# Patient Record
Sex: Female | Born: 1995 | Race: Black or African American | Hispanic: No | Marital: Single | Smoking: Never smoker
Health system: Southern US, Community
[De-identification: ages and names within clinical notes are randomized; demographics above are authoritative.]

## PROBLEM LIST (undated history)

## (undated) DIAGNOSIS — E282 Polycystic ovarian syndrome: Secondary | ICD-10-CM

## (undated) DIAGNOSIS — E669 Obesity, unspecified: Secondary | ICD-10-CM

## (undated) HISTORY — DX: Obesity, unspecified: E66.9

---
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
## 2015-04-16 DIAGNOSIS — Z6831 Body mass index (BMI) 31.0-31.9, adult: Secondary | ICD-10-CM | POA: Insufficient documentation

## 2015-04-23 ENCOUNTER — Emergency Department (HOSPITAL_COMMUNITY)
Admission: EM | Admit: 2015-04-23 | Discharge: 2015-04-23 | Disposition: A | Discharge status: Home / Self Care | Payer: Medicaid Other | Attending: Emergency Medicine | Admitting: Emergency Medicine

## 2015-04-23 ENCOUNTER — Emergency Department (HOSPITAL_COMMUNITY): Payer: Medicaid Other

## 2015-04-23 ENCOUNTER — Encounter (HOSPITAL_COMMUNITY): Payer: Self-pay

## 2015-04-23 DIAGNOSIS — R109 Unspecified abdominal pain: Secondary | ICD-10-CM | POA: Insufficient documentation

## 2015-04-23 DIAGNOSIS — R350 Frequency of micturition: Secondary | ICD-10-CM | POA: Insufficient documentation

## 2015-04-23 DIAGNOSIS — O9989 Other specified diseases and conditions complicating pregnancy, childbirth and the puerperium: Secondary | ICD-10-CM | POA: Diagnosis present

## 2015-04-23 DIAGNOSIS — O209 Hemorrhage in early pregnancy, unspecified: Secondary | ICD-10-CM

## 2015-04-23 DIAGNOSIS — Z3A01 Less than 8 weeks gestation of pregnancy: Secondary | ICD-10-CM | POA: Insufficient documentation

## 2015-04-23 LAB — WET PREP, GENITAL
Trich, Wet Prep: NONE SEEN
Yeast Wet Prep HPF POC: NONE SEEN

## 2015-04-23 LAB — URINALYSIS, ROUTINE W REFLEX MICROSCOPIC
Bilirubin Urine: NEGATIVE
Glucose, UA: NEGATIVE mg/dL
Hgb urine dipstick: NEGATIVE
Ketones, ur: NEGATIVE mg/dL
Leukocytes, UA: NEGATIVE
Nitrite: NEGATIVE
Protein, ur: NEGATIVE mg/dL
Specific Gravity, Urine: 1.005 (ref 1.005–1.030)
Urobilinogen, UA: 1 mg/dL (ref 0.0–1.0)
pH: 7.5 (ref 5.0–8.0)

## 2015-04-23 LAB — COMPREHENSIVE METABOLIC PANEL
ALT: 11 U/L — AB (ref 14–54)
ANION GAP: 9 (ref 5–15)
AST: 17 U/L (ref 15–41)
Albumin: 4 g/dL (ref 3.5–5.0)
Alkaline Phosphatase: 72 U/L (ref 38–126)
BILIRUBIN TOTAL: 1.2 mg/dL (ref 0.3–1.2)
CALCIUM: 9 mg/dL (ref 8.9–10.3)
CO2: 24 mmol/L (ref 22–32)
Chloride: 103 mmol/L (ref 101–111)
Creatinine, Ser: 0.75 mg/dL (ref 0.44–1.00)
GFR calc non Af Amer: >60 mL/min (ref >60)
Glucose, Bld: 91 mg/dL (ref 65–99)
Potassium: 3.9 mmol/L (ref 3.5–5.1)
Sodium: 136 mmol/L (ref 135–145)
Total Protein: 7 g/dL (ref 6.5–8.1)

## 2015-04-23 LAB — CBC WITH DIFFERENTIAL/PLATELET
Basophils Absolute: 0 10*3/uL (ref 0.0–0.1)
Basophils Relative: 1 % (ref 0–1)
EOS PCT: 1 % (ref 0–5)
Eosinophils Absolute: 0.1 10*3/uL (ref 0.0–0.7)
HCT: 33.8 % — ABNORMAL LOW (ref 36.0–46.0)
HEMOGLOBIN: 11.4 g/dL — AB (ref 12.0–15.0)
LYMPHS PCT: 20 % (ref 12–46)
Lymphs Abs: 1.2 10*3/uL (ref 0.7–4.0)
MCH: 28 pg (ref 26.0–34.0)
MCHC: 33.7 g/dL (ref 30.0–36.0)
MCV: 83 fL (ref 78.0–100.0)
MONO ABS: 0.8 10*3/uL (ref 0.1–1.0)
MONOS PCT: 14 % — AB (ref 3–12)
Neutro Abs: 3.7 10*3/uL (ref 1.7–7.7)
Neutrophils Relative %: 64 % (ref 43–77)
Platelets: 306 10*3/uL (ref 150–400)
RBC: 4.07 MIL/uL (ref 3.87–5.11)
RDW: 13.4 % (ref 11.5–15.5)
WBC: 5.8 10*3/uL (ref 4.0–10.5)

## 2015-04-23 LAB — POC URINE PREG, ED: PREG TEST UR: POSITIVE — AB

## 2015-04-23 LAB — HCG, QUANTITATIVE, PREGNANCY: hCG, Beta Chain, Quant, S: 257 m[IU]/mL — ABNORMAL HIGH (ref <5)

## 2015-04-23 NOTE — ED Notes (Signed)
Pt c/o sharp abd pains x3 days. Denies n/v/d. Able to tolerate PO food/fluids. LMP last week of April.

## 2015-04-23 NOTE — ED Provider Notes (Signed)
CSN: [id number]     Arrival date & time 04/23/15  7829 History   First MD Initiated Contact with Patient 04/23/15 [phone]     Chief Complaint  Patient presents with  . Abdominal Pain     (Consider location/radiation/quality/duration/timing/severity/associated sxs/prior Treatment) HPI Comments: Suprapubic abdominal cramping for 3 days, described as sharp, intermittent pains without radiation.  She has had no associated nausea, vomiting or diarrhea.  She has had urinary frequency without dysuria or hematuria.  She states she has not had a period about 6 weeks and she has noticed some breast tenderness.  She took a home pregnancy test yesterday which was positive  Patient is a 19 y.o. female presenting with abdominal pain. The history is provided by the patient. No language interpreter was used.  Abdominal Pain Associated symptoms: no chest pain, no chills, no cough, no diarrhea, no dysuria, no fatigue, no fever, no nausea, no shortness of breath, no sore throat and no vomiting     History reviewed. No pertinent past medical history. History reviewed. No pertinent past surgical history. History reviewed. No pertinent family history. History  Substance Use Topics  . Smoking status: Never Smoker   . Smokeless tobacco: Not on file  . Alcohol Use: Not on file   OB History    No data available     Review of Systems  Constitutional: Negative for fever, chills, diaphoresis, activity change, appetite change and fatigue.  HENT: Negative for congestion, facial swelling, rhinorrhea and sore throat.   Eyes: Negative for photophobia and discharge.  Respiratory: Negative for cough, chest tightness and shortness of breath.   Cardiovascular: Negative for chest pain, palpitations and leg swelling.  Gastrointestinal: Positive for abdominal pain. Negative for nausea, vomiting and diarrhea.  Endocrine: Negative for polydipsia and polyuria.  Genitourinary: Negative for dysuria, frequency, difficulty  urinating and pelvic pain.  Musculoskeletal: Negative for back pain, arthralgias, neck pain and neck stiffness.  Skin: Negative for color change and wound.  Allergic/Immunologic: Negative for immunocompromised state.  Neurological: Negative for facial asymmetry, weakness, numbness and headaches.  Hematological: Does not bruise/bleed easily.  Psychiatric/Behavioral: Negative for confusion and agitation.      Allergies  Coconut oil  Home Medications   Prior to Admission medications   Medication Sig Start Date End Date Taking? Authorizing Provider  ibuprofen (ADVIL,MOTRIN) 200 MG tablet Take 200 mg by mouth every 6 (six) hours as needed for mild pain.   Yes Historical Provider, MD   BP 127/68 mmHg  Pulse 90  Temp(Src) 98.8 F (37.1 C)  Resp 16  SpO2 100%  LMP 03/14/2015 Physical Exam  Constitutional: She is oriented to person, place, and time. She appears well-developed and well-nourished. No distress.  HENT:  Head: Normocephalic and atraumatic.  Mouth/Throat: No oropharyngeal exudate.  Eyes: Pupils are equal, round, and reactive to light.  Neck: Normal range of motion. Neck supple.  Cardiovascular: Normal rate, regular rhythm and normal heart sounds.  Exam reveals no gallop and no friction rub.   No murmur heard. Pulmonary/Chest: Effort normal and breath sounds normal. No respiratory distress. She has no wheezes. She has no rales.  Abdominal: Soft. Bowel sounds are normal. She exhibits no distension and no mass. There is no tenderness. There is no rebound and no guarding.  Musculoskeletal: Normal range of motion. She exhibits no edema or tenderness.  Neurological: She is alert and oriented to person, place, and time.  Skin: Skin is warm and dry.  Psychiatric: She has a normal mood and  affect.    ED Course  Procedures (including critical care time) Labs Review Labs Reviewed  WET PREP, GENITAL - Abnormal; Notable for the following:    Clue Cells Wet Prep HPF POC FEW (*)     WBC, Wet Prep HPF POC MANY (*)    All other components within normal limits  CBC WITH DIFFERENTIAL/PLATELET - Abnormal; Notable for the following:    Hemoglobin 11.4 (*)    HCT 33.8 (*)    Monocytes Relative 14 (*)    All other components within normal limits  COMPREHENSIVE METABOLIC PANEL - Abnormal; Notable for the following:    BUN <5 (*)    ALT 11 (*)    All other components within normal limits  HCG, QUANTITATIVE, PREGNANCY - Abnormal; Notable for the following:    hCG, Beta Chain, Quant, S 257 (*)    All other components within normal limits  POC URINE PREG, ED - Abnormal; Notable for the following:    Preg Test, Ur POSITIVE (*)    All other components within normal limits  URINALYSIS, ROUTINE W REFLEX MICROSCOPIC (NOT AT [facility])  RPR  HIV ANTIBODY (ROUTINE TESTING)  POC URINE PREG, ED  ABO/RH  GC/CHLAMYDIA PROBE AMP ([place]) NOT AT [facility]    Imaging Review [place] Ob Comp Less 14 Wks  04/23/2015   CLINICAL DATA:  Initial encounter for abdominal pain with positive pregnancy test.  EXAM: OBSTETRIC <14 WK US AND TRANSVAGINAL OB US  TECHNIQUE: Both transabdominal and transvaginal ultrasound examinations were performed for complete evaluation of the gestation as well as the maternal uterus, adnexal regions, and pelvic cul-de-sac. Transvaginal technique was performed to assess early pregnancy.  COMPARISON:  None.  FINDINGS: Intrauterine gestational sac: Not visualized  Yolk sac:  Not visualized  Embryo:  Not visualized  Maternal uterus/adnexae: Maternal right ovary is unremarkable. Thick walled cystic lesion identified in the left ovary without substantial hyperemia on color Doppler assessment. Small amount of free fluid is identified in the cul-de-sac and left adnexal space.  IMPRESSION: No intrauterine gestational sac. Given the history of a positive pregnancy test, differential considerations include intrauterine gestation too early to visualize, completed abortion, or nonvisualized  ectopic pregnancy. Cystic lesion identified in the left ovary shows no substantial associated hyperemia. This is probably a corpus luteum cyst rather than intra-ovarian ectopic pregnancy given that intra-ovarian ectopic gestation is quite rare. Close clinical correlation is recommended with serial beta-hCG and followup ultrasound as warranted.   Electronically Signed   By: Kennith CenterEric  Mansell M.D.   On: 04/23/2015 14:09   [place] Ob Transvaginal  04/23/2015   CLINICAL DATA:  Initial encounter for abdominal pain with positive pregnancy test.  EXAM: OBSTETRIC <14 WK US AND TRANSVAGINAL OB US  TECHNIQUE: Both transabdominal and transvaginal ultrasound examinations were performed for complete evaluation of the gestation as well as the maternal uterus, adnexal regions, and pelvic cul-de-sac. Transvaginal technique was performed to assess early pregnancy.  COMPARISON:  None.  FINDINGS: Intrauterine gestational sac: Not visualized  Yolk sac:  Not visualized  Embryo:  Not visualized  Maternal uterus/adnexae: Maternal right ovary is unremarkable. Thick walled cystic lesion identified in the left ovary without substantial hyperemia on color Doppler assessment. Small amount of free fluid is identified in the cul-de-sac and left adnexal space.  IMPRESSION: No intrauterine gestational sac. Given the history of a positive pregnancy test, differential considerations include intrauterine gestation too early to visualize, completed abortion, or nonvisualized ectopic pregnancy. Cystic lesion identified in the left ovary  shows no substantial associated hyperemia. This is probably a corpus luteum cyst rather than intra-ovarian ectopic pregnancy given that intra-ovarian ectopic gestation is quite rare. Close clinical correlation is recommended with serial beta-hCG and followup ultrasound as warranted.   Electronically Signed   By: Kennith Center M.D.   On: 04/23/2015 14:09     EKG Interpretation None      MDM   Final diagnoses:   Abdominal pain  Vaginal bleeding in pregnancy, first trimester    Pt is a 19 y.o. female with Pmhx as above who presents with sharp intermittent low abdominal cramping for 3 days.  She has not had a period for about 6 weeks and is also had breast tenderness.  She states she took a home pregnancy test yesterday which was positive.  She denies vaginal bleeding or discharge.  She has had urinary frequency.  On physical exam, vital signs are stable and she is in no acute distress.  Abdominal exam is benign.  She has scant vaginal bleeding from a closed os on pelvic exam, no adnexal cervical motion tenderness or uterine tenderness.   Beta-hCG is 257.  Blood type is B+.  SHE sound shows no intrauterine gestational sac.  She has a cystic lesion identified in the left ovary with no substantial hyperemia, which radiology feels this likely corpus luteum cyst rather than intraovarian ectopic pregnancy given the rarity of this condition.  This is supported by her lack of pain at this location on exam.  I spoke with the [facility] OB on-call, who is asked that she come to the MAU 48 hours for repeat beta hCG and ultrasound as we cannot rule out ectopic.  Patient has been given bleeding precautions for ectopic coating increasing vaginal bleeding, Heavy bleeding, syncope.    [name] evaluation in the Emergency Department is complete. It has been determined that no acute conditions requiring further emergency intervention are present at this time. The patient/guardian have been advised of the diagnosis and plan. We have discussed signs and symptoms that warrant return to the ED, such as changes or worsening in symptoms, worsening pain, worsening bleeding, passing out.       Toy Cookey, MD 04/23/15 1536

## 2015-04-23 NOTE — Discharge Instructions (Signed)

## 2015-04-24 LAB — HIV ANTIBODY (ROUTINE TESTING W REFLEX): HIV Screen 4th Generation wRfx: NONREACTIVE

## 2015-04-24 LAB — RPR: RPR Ser Ql: NONREACTIVE

## 2015-04-25 ENCOUNTER — Inpatient Hospital Stay (HOSPITAL_COMMUNITY)
Admission: AD | Admit: 2015-04-25 | Discharge: 2015-04-25 | Disposition: A | Discharge status: Home / Self Care | Payer: Medicaid Other | Source: Ambulatory Visit | Attending: Obstetrics & Gynecology | Admitting: Obstetrics & Gynecology

## 2015-04-25 ENCOUNTER — Encounter (HOSPITAL_COMMUNITY): Payer: Self-pay | Admitting: *Deleted

## 2015-04-25 DIAGNOSIS — O3680X0 Pregnancy with inconclusive fetal viability, not applicable or unspecified: Secondary | ICD-10-CM

## 2015-04-25 DIAGNOSIS — Z3A01 Less than 8 weeks gestation of pregnancy: Secondary | ICD-10-CM | POA: Insufficient documentation

## 2015-04-25 DIAGNOSIS — O9989 Other specified diseases and conditions complicating pregnancy, childbirth and the puerperium: Secondary | ICD-10-CM | POA: Diagnosis not present

## 2015-04-25 DIAGNOSIS — R1030 Lower abdominal pain, unspecified: Secondary | ICD-10-CM | POA: Diagnosis not present

## 2015-04-25 LAB — ABO/RH: ABO/RH(D): B POS

## 2015-04-25 LAB — HCG, QUANTITATIVE, PREGNANCY: hCG, Beta Chain, Quant, S: 403 m[IU]/mL — ABNORMAL HIGH (ref <5)

## 2015-04-25 NOTE — MAU Provider Note (Signed)
History     CSN: [id number]  Arrival date and time: 04/25/15 1013      No chief complaint on file.  HPI Comments: Renee Lester is a 19 y.o. G1P0 at [redacted]w[redacted]d who was seen at [facility] ED 2 days ago with 3d hx of lower abdominal cramping. She had twinges of lower abdominal pain yesterday but none since then. She has not had any vaginal bleeding. Pregnancy is desired. She has good support.   Abdominal Pain This is a recurrent problem. The current episode started in the past 7 days. The problem occurs rarely. The problem has been gradually improving. The pain is located in the suprapubic region. The pain is at a severity of 0/10. The patient is experiencing no pain. The quality of the pain is cramping. Pertinent negatives include no anorexia, constipation, diarrhea, dysuria, fever, frequency, headaches, nausea or vomiting. Nothing aggravates the pain. The pain is relieved by nothing. She has tried nothing for the symptoms.      OB History    Gravida Para Term Preterm AB TAB SAB Ectopic Multiple Living   1                 No past surgical history on file.  No family history on file.  History  Substance Use Topics  . Smoking status: Never Smoker   . Smokeless tobacco: Not on file  . Alcohol Use: Not on file    Allergies:  Allergies  Allergen Reactions  . Coconut Oil Other (See Comments)    Any coconut product." Tongue gets bumps on it."    Prescriptions prior to admission  Medication Sig Dispense Refill Last Dose  . ibuprofen (ADVIL,MOTRIN) 200 MG tablet Take 200 mg by mouth every 6 (six) hours as needed for mild pain.   Past Month at Unknown time    Review of Systems  Constitutional: Negative for fever and chills.  Cardiovascular: Negative for chest pain.  Gastrointestinal: Positive for abdominal pain. Negative for nausea, vomiting, diarrhea, constipation and anorexia.  Genitourinary: Negative for dysuria and frequency.  Neurological: Negative for dizziness and headaches.   Psychiatric/Behavioral: Negative for depression. The patient is not nervous/anxious.    Physical Exam   Last menstrual period 03/14/2015.  Physical Exam  Nursing note and vitals reviewed. Constitutional: She is oriented to person, place, and time. She appears well-developed and well-nourished.  Cardiovascular: Normal rate.   GI: Soft. There is no tenderness.  Neurological: She is alert and oriented to person, place, and time.  Skin: Skin is warm and dry.  Psychiatric: She has a normal mood and affect. Her behavior is normal.     CLINICAL DATA: Initial encounter for abdominal pain with positive pregnancy test.  EXAM: OBSTETRIC <14 WK [place] AND TRANSVAGINAL OB US  TECHNIQUE: Both transabdominal and transvaginal ultrasound examinations were performed for complete evaluation of the gestation as well as the maternal uterus, adnexal regions, and pelvic cul-de-sac. Transvaginal technique was performed to assess early pregnancy.  COMPARISON: None.  FINDINGS: Intrauterine gestational sac: Not visualized  Yolk sac: Not visualized  Embryo: Not visualized  Maternal uterus/adnexae: Maternal right ovary is unremarkable. Thick walled cystic lesion identified in the left ovary without substantial hyperemia on color Doppler assessment. Small amount of free fluid is identified in the cul-de-sac and left adnexal space.  IMPRESSION: No intrauterine gestational sac. Given the history of a positive pregnancy test, differential considerations include intrauterine gestation too early to visualize, completed abortion, or nonvisualized ectopic pregnancy. Cystic lesion identified in  the left ovary shows no substantial associated hyperemia. This is probably a corpus luteum cyst rather than intra-ovarian ectopic pregnancy given that intra-ovarian ectopic gestation is quite rare. Close clinical correlation is recommended with serial beta-hCG and followup ultrasound as  warranted.   Electronically Signed  By: Kennith CenterEric Mansell M.D.  On: 04/23/2015 14:09        Ref Range 2d ago    hCG, Beta Chain, Quant, S <5 mIU/mL 257 (H)   Comments:              MAU Course  Procedures Results for orders placed or performed during the hospital encounter of 04/25/15 (from the past 24 hour(s))  hCG, quantitative, pregnancy     Status: Abnormal   Collection Time: 04/25/15 10:37 AM  Result Value Ref Range   hCG, Beta Chain, Quant, S 403 (H) <5 mIU/mL   MDM Appropriate rise in quant  Assessment and Plan   1. Pregnancy, location unknown     Discharge home in stable condition with ectopic precautions Follow-up Information    Follow up with Nursepractioner Mau, NP In 1 week.   Why:  Someone from ultrasound will call you with appt.      Malon Siddall 04/25/2015, 10:25 AM

## 2015-04-25 NOTE — Discharge Instructions (Signed)
°Ectopic Pregnancy °An ectopic pregnancy is when the fertilized egg attaches (implants) outside the uterus. Most ectopic pregnancies occur in the fallopian tube. Rarely do ectopic pregnancies occur on the ovary, intestine, pelvis, or cervix. In an ectopic pregnancy, the fertilized egg does not have the ability to develop into a normal, healthy baby.  °A ruptured ectopic pregnancy is one in which the fallopian tube gets torn or bursts and results in internal bleeding. Often there is intense abdominal pain, and sometimes, vaginal bleeding. Having an ectopic pregnancy can be life threatening. If left untreated, this dangerous condition can lead to a blood transfusion, abdominal surgery, or even death. °CAUSES  °Damage to the fallopian tubes is the suspected cause in most ectopic pregnancies.  °RISK FACTORS °Depending on your circumstances, the risk of having an ectopic pregnancy will vary. The level of risk can be divided into three categories. °High Risk °· You have gone through infertility treatment. °· You have had a previous ectopic pregnancy. °· You have had previous tubal surgery. °· You have had previous surgery to have the fallopian tubes tied (tubal ligation). °· You have tubal problems or diseases. °· You have been exposed to DES. DES is a medicine that was used until 1971 and had effects on babies whose mothers took the medicine. °· You become pregnant while using an intrauterine device (IUD) for birth control.  °Moderate Risk °· You have a history of infertility. °· You have a history of a sexually transmitted infection (STI). °· You have a history of pelvic inflammatory disease (PID). °· You have scarring from endometriosis. °· You have multiple sexual partners. °· You smoke.  °Low Risk °· You have had previous pelvic surgery. °· You use vaginal douching. °· You became sexually active before 18 years of age. °SIGNS AND SYMPTOMS  °An ectopic pregnancy should be suspected in anyone who has missed a period  and has abdominal pain or bleeding. °· You may experience normal pregnancy symptoms, such as: °¨ Nausea. °¨ Tiredness. °¨ Breast tenderness. °· Other symptoms may include: °¨ Pain with intercourse. °¨ Irregular vaginal bleeding or spotting. °¨ Cramping or pain on one side or in the lower abdomen. °¨ Fast heartbeat. °¨ Passing out while having a bowel movement. °· Symptoms of a ruptured ectopic pregnancy and internal bleeding may include: °¨ Sudden, severe pain in the abdomen and pelvis. °¨ Dizziness or fainting. °¨ Pain in the shoulder area. °DIAGNOSIS  °Tests that may be performed include: °· A pregnancy test. °· An ultrasound test. °· Testing the specific level of pregnancy hormone in the bloodstream. °· Taking a sample of uterus tissue (dilation and curettage, D&C). °· Surgery to perform a visual exam of the inside of the abdomen using a thin, lighted tube with a tiny camera on the end (laparoscope). °TREATMENT  °An injection of a medicine called methotrexate may be given. This medicine causes the pregnancy tissue to be absorbed. It is given if: °· The diagnosis is made early. °· The fallopian tube has not ruptured. °· You are considered to be a good candidate for the medicine. °Usually, pregnancy hormone blood levels are checked after methotrexate treatment. This is to be sure the medicine is effective. It may take 4-6 weeks for the pregnancy to be absorbed (though most pregnancies will be absorbed by 3 weeks). °Surgical treatment may be needed. A laparoscope may be used to remove the pregnancy tissue. If severe internal bleeding occurs, a cut (incision) may be made in the lower abdomen (laparotomy), and the ectopic   pregnancy is removed. This stops the bleeding. Part of the fallopian tube, or the whole tube, may be removed as well (salpingectomy). After surgery, pregnancy hormone tests may be done to be sure there is no pregnancy tissue left. You may receive a Rho (D) immune globulin shot if you are Rh negative  and the father is Rh positive, or if you do not know the Rh type of the father. This is to prevent problems with any future pregnancy. °SEEK IMMEDIATE MEDICAL CARE IF:  °You have any symptoms of an ectopic pregnancy. This is a medical emergency. °MAKE SURE YOU: °· Understand these instructions. °· Will watch your condition. °· Will get help right away if you are not doing well or get worse. °Document Released: 12/14/2004 Document Revised: 03/23/2014 Document Reviewed: 06/05/2013 °ExitCare® Patient Information ©2015 [employer]. This information is not intended to replace advice given to you by your health care provider. Make sure you discuss any questions you have with your health care provider. ° ° °

## 2015-04-26 LAB — GC/CHLAMYDIA PROBE AMP (~~LOC~~) NOT AT ARMC
Chlamydia: NEGATIVE
NEISSERIA GONORRHEA: NEGATIVE

## 2015-05-06 ENCOUNTER — Ambulatory Visit (HOSPITAL_COMMUNITY)
Admission: RE | Admit: 2015-05-06 | Discharge: 2015-05-06 | Disposition: A | Discharge status: Home / Self Care | Payer: Medicaid Other | Source: Ambulatory Visit | Attending: Obstetrics and Gynecology | Admitting: Obstetrics and Gynecology

## 2015-05-06 ENCOUNTER — Inpatient Hospital Stay (HOSPITAL_COMMUNITY)
Admission: AD | Admit: 2015-05-06 | Discharge: 2015-05-06 | Disposition: A | Discharge status: Home / Self Care | Payer: Medicaid Other | Source: Ambulatory Visit | Attending: Obstetrics & Gynecology | Admitting: Obstetrics & Gynecology

## 2015-05-06 DIAGNOSIS — Z3A01 Less than 8 weeks gestation of pregnancy: Secondary | ICD-10-CM | POA: Insufficient documentation

## 2015-05-06 DIAGNOSIS — R109 Unspecified abdominal pain: Secondary | ICD-10-CM

## 2015-05-06 DIAGNOSIS — O9989 Other specified diseases and conditions complicating pregnancy, childbirth and the puerperium: Secondary | ICD-10-CM | POA: Insufficient documentation

## 2015-05-06 DIAGNOSIS — O26899 Other specified pregnancy related conditions, unspecified trimester: Secondary | ICD-10-CM

## 2015-05-06 DIAGNOSIS — O3680X0 Pregnancy with inconclusive fetal viability, not applicable or unspecified: Secondary | ICD-10-CM

## 2015-05-06 NOTE — MAU Provider Note (Signed)
Renee Lester is a 19 y.o. G1P0 at [redacted]w[redacted]d who presents to MAU today for follow-up US results. She denies abdominal pain, vaginal bleeding, N/V or fever today.   LMP 03/14/2015  GENERAL: Well-developed, well-nourished female in no acute distress.  HEENT: Normocephalic, atraumatic.   LUNGS: Effort normal HEART: Regular rate  SKIN: Warm, dry and without erythema PSYCH: Normal mood and affect  US Ob Transvaginal  05/06/2015   CLINICAL DATA:  Pregnancy. No pain or bleeding. Beta HCG 257 13 days ago  EXAM: TRANSVAGINAL OB ULTRASOUND  TECHNIQUE: Transvaginal ultrasound was performed for complete evaluation of the gestation as well as the maternal uterus, adnexal regions, and pelvic cul-de-sac.  COMPARISON:  None.  FINDINGS: Intrauterine gestational sac: Visualized/normal in shape.  Yolk sac:  Not visualized  Embryo:  Not visualized  Cardiac Activity: Not visualize  Heart Rate: Not visualized bpm  MSD: 0.57 cm  mm   5 w   1  d  CRL:     mm    w  d                  [place] EDC:  Maternal uterus/adnexae: New probable corpus luteum cyst left ovary. Trace free fluid.  IMPRESSION: Early intrauterine pregnancy estimated 5 week 1 day. Fetus and yolk sac not visualized. Follow-up recommended to confirm viable pregnancy.   Electronically Signed   By: Marlan Palau M.D.   On: 05/06/2015 14:33    A: IUGS at [redacted]w[redacted]d Pregnancy of unknown location  P: Discharge home Outpatient [place] ordered for 1 week to confirm viability Ectopic precautions discussed Patient may return to MAU as needed or if her condition were to change or worsen   Marny Lowenstein, PA-C  05/06/2015 2:46 PM

## 2015-05-06 NOTE — Progress Notes (Signed)
Only seen by APP

## 2015-05-06 NOTE — Discharge Instructions (Signed)
First Trimester of Pregnancy The first trimester of pregnancy is from week 1 until the end of week 12 (months 1 through 3). During this time, your baby will begin to develop inside you. At 6-8 weeks, the eyes and face are formed, and the heartbeat can be seen on ultrasound. At the end of 12 weeks, all the baby's organs are formed. Prenatal care is all the medical care you receive before the birth of your baby. Make sure you get good prenatal care and follow all of your doctor's instructions. HOME CARE  Medicines  Take medicine only as told by your doctor. Some medicines are safe and some are not during pregnancy.  Take your prenatal vitamins as told by your doctor.  Take medicine that helps you poop (stool softener) as needed if your doctor says it is okay. Diet  Eat regular, healthy meals.  Your doctor will tell you the amount of weight gain that is right for you.  Avoid raw meat and uncooked cheese.  If you feel sick to your stomach (nauseous) or throw up (vomit):  Eat 4 or 5 small meals a day instead of 3 large meals.  Try eating a few soda crackers.  Drink liquids between meals instead of during meals.  If you have a hard time pooping (constipation):  Eat high-fiber foods like fresh vegetables, fruit, and whole grains.  Drink enough fluids to keep your pee (urine) clear or pale yellow. Activity and Exercise  Exercise only as told by your doctor. Stop exercising if you have cramps or pain in your lower belly (abdomen) or low back.  Try to avoid standing for long periods of time. Move your legs often if you must stand in one place for a long time.  Avoid heavy lifting.  Wear low-heeled shoes. Sit and stand up straight.  You can have sex unless your doctor tells you not to. Relief of Pain or Discomfort  Wear a good support bra if your breasts are sore.  Take warm water baths (sitz baths) to soothe pain or discomfort caused by hemorrhoids. Use hemorrhoid cream if your  doctor says it is okay.  Rest with your legs raised if you have leg cramps or low back pain.  Wear support hose if you have puffy, bulging veins (varicose veins) in your legs. Raise (elevate) your feet for 15 minutes, 3-4 times a day. Limit salt in your diet. Prenatal Care  Schedule your prenatal visits by the twelfth week of pregnancy.  Write down your questions. Take them to your prenatal visits.  Keep all your prenatal visits as told by your doctor. Safety  Wear your seat belt at all times when driving.  Make a list of emergency phone numbers. The list should include numbers for family, friends, the hospital, and police and fire departments. General Tips  Ask your doctor for a referral to a local prenatal class. Begin classes no later than at the start of month 6 of your pregnancy.  Ask for help if you need counseling or help with nutrition. Your doctor can give you advice or tell you where to go for help.  Do not use hot tubs, steam rooms, or saunas.  Do not douche or use tampons or scented sanitary pads.  Do not cross your legs for long periods of time.  Avoid litter boxes and soil used by cats.  Avoid all smoking, herbs, and alcohol. Avoid drugs not approved by your doctor.  Visit your dentist. At home, brush your teeth   with a soft toothbrush. Be gentle when you floss. GET HELP IF:  You are dizzy.  You have mild cramps or pressure in your lower belly.  You have a nagging pain in your belly area.  You continue to feel sick to your stomach, throw up, or have watery poop (diarrhea).  You have a bad smelling fluid coming from your vagina.  You have pain with peeing (urination).  You have increased puffiness (swelling) in your face, hands, legs, or ankles. GET HELP RIGHT AWAY IF:   You have a fever.  You are leaking fluid from your vagina.  You have spotting or bleeding from your vagina.  You have very bad belly cramping or pain.  You gain or lose weight  rapidly.  You throw up blood. It may look like coffee grounds.  You are around people who have German measles, fifth disease, or chickenpox.  You have a very bad headache.  You have shortness of breath.  You have any kind of trauma, such as from a fall or a car accident. Document Released: 04/24/2008 Document Revised: 03/23/2014 Document Reviewed: 09/16/2013 ExitCare Patient Information 2015 [employer]. This information is not intended to replace advice given to you by your health care provider. Make sure you discuss any questions you have with your health care provider.  

## 2015-05-14 ENCOUNTER — Inpatient Hospital Stay (HOSPITAL_COMMUNITY)
Admission: AD | Admit: 2015-05-14 | Discharge: 2015-05-14 | Disposition: A | Discharge status: Home / Self Care | Payer: Medicaid Other | Source: Ambulatory Visit | Attending: Obstetrics and Gynecology | Admitting: Obstetrics and Gynecology

## 2015-05-14 ENCOUNTER — Ambulatory Visit (HOSPITAL_COMMUNITY)
Admission: RE | Admit: 2015-05-14 | Discharge: 2015-05-14 | Disposition: A | Discharge status: Home / Self Care | Payer: Medicaid Other | Source: Ambulatory Visit | Attending: Medical | Admitting: Medical

## 2015-05-14 DIAGNOSIS — R109 Unspecified abdominal pain: Secondary | ICD-10-CM

## 2015-05-14 DIAGNOSIS — Z36 Encounter for antenatal screening of mother: Secondary | ICD-10-CM | POA: Insufficient documentation

## 2015-05-14 DIAGNOSIS — N831 Corpus luteum cyst: Secondary | ICD-10-CM | POA: Diagnosis not present

## 2015-05-14 DIAGNOSIS — O9989 Other specified diseases and conditions complicating pregnancy, childbirth and the puerperium: Secondary | ICD-10-CM

## 2015-05-14 DIAGNOSIS — Z3A01 Less than 8 weeks gestation of pregnancy: Secondary | ICD-10-CM | POA: Diagnosis not present

## 2015-05-14 DIAGNOSIS — O26899 Other specified pregnancy related conditions, unspecified trimester: Secondary | ICD-10-CM

## 2015-05-14 DIAGNOSIS — O3481 Maternal care for other abnormalities of pelvic organs, first trimester: Secondary | ICD-10-CM | POA: Diagnosis not present

## 2015-05-14 NOTE — MAU Provider Note (Signed)
Subjective:   Pt is a 19 y.o. G1P0 here for follow-up ultrasound.  Upon review of the records patient was first seen on 04/25/15 for abdominal pain.   BHCG on that day was 403.  Ultrasound showed no IUGS.  Wet prep and GC/CT collected.  Results were few clue on wet prep and neg GC/CT.   Pt discharged home with ectopic precautions and follow-up ultrasound in one week.  Last seen in MAU on 05/06/15.      Repeat ultrasound showed single IUGS with no yolk or embryo.    Pt here today with no report of vaginal bleeding or pelvic pain.   Objective: Physical Exam  There were no vitals filed for this visit. Constitutional: She is oriented to person, place, and time. She appears well-developed and well-nourished. No distress.  Neck: Normal range of motion.  Pulmonary/Chest: Effort normal. No respiratory distress.  Musculoskeletal: Normal range of motion.  Neurological: She is alert and oriented to person, place, and time.  Skin: Skin is warm and dry.   Ultrasound: Two adjacent intrauterine gestational sacs with MSD #1, 5 weeks 4 days and MSD #2, 5 weeks 0 days. No visualized yolk sac or embryo in either sac. Findings suspicious for non viability, although still may represent normal early twin pregnancy. Recommend correlation with serial quantitative beta HCG and follow-up ultrasound in 2 Weeks.  Assessment: 19 y.o. G1P0 at 5 wks Pregnancy   Plan: Consulted with Dr. Jolayne Panther > Reviewed HPI/Exam/ultrasound > repeat ultrasound in one week Reviewed ectopic/pregnancy precautions  Marlis Edelson, CNM

## 2015-05-14 NOTE — MAU Provider Note (Deleted)
  History     CSN: [id number]  Arrival date and time: 05/14/15 1551   None     No chief complaint on file.  HPI FILICIA LIENDO is 19 y.o. G1P0 [redacted]w[redacted]d weeks presenting for follow up ultrasound.  On 6/16 U/S showed her to be [redacted]w[redacted]d which is behind dates by LMP, without YS or Embryo.     No past medical history on file.  No past surgical history on file.  No family history on file.  History  Substance Use Topics  . Smoking status: Never Smoker   . Smokeless tobacco: Not on file  . Alcohol Use: Not on file    Allergies:  Allergies  Allergen Reactions  . Coconut Oil Other (See Comments)    Any coconut product." Tongue gets bumps on it."    No prescriptions prior to admission    ROS Physical Exam   Last menstrual period 03/14/2015.  Physical Exam     US Ob Transvaginal  05/14/2015   CLINICAL DATA:  Previous ultrasound 05/06/2015 demonstrating gestational sac only. Quantitative beta HCG 04/25/2015, 403. LMP unsure but sometime at the end of April. No vaginal bleeding or cramping.  EXAM: TRANSVAGINAL OB ULTRASOUND  TECHNIQUE: Transvaginal ultrasound was performed for complete evaluation of the gestation as well as the maternal uterus, adnexal regions, and pelvic cul-de-sac.  COMPARISON:  05/06/2015  FINDINGS: Intrauterine gestational sac: 2 adjacent intrauterine gestational sacs visualized/normal in shape.  Yolk sac:  Not visualized.  Embryo:  Not visualized.  Cardiac Activity: Not visualized.  Heart Rate: Not visualized.  MSD:  #1,  9.0  mm   5 w   4  d  MSD:  #2,   4.6 mm      5 w      0 d  No evidence of subchorionic hemorrhage.  Maternal uterus/adnexae: Ovaries are normal in size, shape position with small corpus luteal cyst over the left ovary. Small amount of simple free fluid over the left adnexa and adjacent the left uterine cornua.  IMPRESSION: Two adjacent intrauterine gestational sacs with MSD #1, 5 weeks 4 days and MSD #2, 5 weeks 0 days. No visualized yolk sac or  embryo in either sac. Findings suspicious for non viability, although still may represent normal early twin pregnancy. Recommend correlation with serial quantitative beta HCG and follow-up ultrasound in 2 weeks.   Electronically Signed   By: Elberta Fortis M.D.   On: 05/14/2015 15:41   MAU Course  Procedures  MDM [place] Discussed with Dr. Jolayne Panther plan for follow up.  Repeat U/S in 1 week   Assessment and Plan  A: Sebastiana Wuest,EVE M 05/14/2015, 4:39 PM

## 2015-05-25 ENCOUNTER — Ambulatory Visit (HOSPITAL_COMMUNITY)
Admission: RE | Admit: 2015-05-25 | Discharge: 2015-05-25 | Disposition: A | Discharge status: Home / Self Care | Payer: Medicaid Other | Source: Ambulatory Visit | Attending: Family | Admitting: Family

## 2015-05-25 ENCOUNTER — Inpatient Hospital Stay (HOSPITAL_COMMUNITY)
Admission: AD | Admit: 2015-05-25 | Discharge: 2015-05-25 | Disposition: A | Discharge status: Home / Self Care | Payer: Medicaid Other | Source: Ambulatory Visit | Attending: Family Medicine | Admitting: Family Medicine

## 2015-05-25 DIAGNOSIS — O26899 Other specified pregnancy related conditions, unspecified trimester: Secondary | ICD-10-CM

## 2015-05-25 DIAGNOSIS — Z3A10 10 weeks gestation of pregnancy: Secondary | ICD-10-CM

## 2015-05-25 DIAGNOSIS — O9989 Other specified diseases and conditions complicating pregnancy, childbirth and the puerperium: Secondary | ICD-10-CM

## 2015-05-25 DIAGNOSIS — R109 Unspecified abdominal pain: Secondary | ICD-10-CM | POA: Insufficient documentation

## 2015-05-25 DIAGNOSIS — O200 Threatened abortion: Secondary | ICD-10-CM

## 2015-05-25 DIAGNOSIS — O208 Other hemorrhage in early pregnancy: Secondary | ICD-10-CM | POA: Diagnosis not present

## 2015-05-25 DIAGNOSIS — Z3A01 Less than 8 weeks gestation of pregnancy: Secondary | ICD-10-CM | POA: Diagnosis not present

## 2015-05-25 NOTE — MAU Provider Note (Signed)
Subjective:  Renee Lester is a 19 y.o. female presenting to MAU for a follow up US. She was originally seen in the ED on 6/3 with abdominal pain following a positive home pregnancy test. She returned in 48 hours and her beta hcg level went from 257 (6/3) to 403 (6/5).   She has had 3 ultrasounds since 6/3; minimal growth shown.   Currently she denies pain or bleeding.   Objective:  GENERAL: Well-developed, well-nourished female in no acute distress.  LUNGS: Effort normal SKIN: Warm, dry and without erythema PSYCH: Normal mood and affect  There were no vitals filed for this visit.  [place] Ob Transvaginal  05/25/2015   CLINICAL DATA:  Abdominal pain during pregnancy  EXAM: TWIN OBSTETRICAL ULTRASOUND <14 WKS  COMPARISON:  None.  FINDINGS: TWIN 1  Intrauterine gestational sac: Two adjacent intrauterine gestational sacs again visualize.  Yolk sac:  No  Embryo:  No  Cardiac Activity: No  Heart Rate: Not applicable bpm  MSD #1: 12.6  mm   6 w   0  d  MSD #2:  7.1 mm   5 w 2 d  Maternal uterus/adnexae:  Subchorionic hemorrhage: Moderate subchorionic hemorrhage. This measures 3 x 1.1 x 3.9 cm.  Right ovary: Normal  Left ovary: Normal  Other :Some internal echoes identified within sac #1  Free fluid:  None.  IMPRESSION: 1. Again noted are 2 adjacent intrauterine gestational sacs. These demonstrate mild increase in size from previous exam. However, no visualize yolk sac, embryo in either sac. Findings remain suspicious but not yet definitive for failed pregnancy. Recommend follow-up US in 10-14 days for definitive diagnosis. This recommendation follows SRU consensus guidelines: Diagnostic Criteria for Nonviable Pregnancy Early in the First Trimester. Malva Limes Engl J Med 2013; 161:0960-45; 369:1443-51. 2. Moderate size subchorionic hemorrhage   Electronically Signed   By: Signa Kellaylor  Stroud M.D.   On: 05/25/2015 10:55    MDM:  B positive blood type  Reviewed US and beta hcg levels with Dr. Adrian BlackwaterStinson including course of care.    Patient was offered cytotec for failed pregnancy and the patient declined.  This pregnancy is highly desired and though US suggests likely failed pregnancy due to only mild growth in gestational sacs the patient would like one more US in order to make a final decision.    Assessment:  1. Abdominal pain in pregnancy   2. Threatened miscarriage    Plan:  Discharge home in stable condition US in 10-14 days  Patient to follow up in the [facility] per Dr. Adrian BlackwaterStinson Following US.  Ectopic precautions Return to MAU if symptoms worsen      Duane LopeJennifer I Sharrie Self, NP 05/25/2015 12:07 PM

## 2015-06-03 ENCOUNTER — Encounter (HOSPITAL_COMMUNITY): Payer: Self-pay | Admitting: *Deleted

## 2015-06-03 ENCOUNTER — Encounter (HOSPITAL_COMMUNITY): Payer: Self-pay | Admitting: Vascular Surgery

## 2015-06-03 ENCOUNTER — Inpatient Hospital Stay (HOSPITAL_COMMUNITY): Payer: Medicaid Other

## 2015-06-03 ENCOUNTER — Emergency Department (HOSPITAL_COMMUNITY)
Admission: EM | Admit: 2015-06-03 | Discharge: 2015-06-03 | Discharge status: Against Medical Advice | Payer: Medicaid Other | Source: Home / Self Care

## 2015-06-03 ENCOUNTER — Inpatient Hospital Stay (HOSPITAL_COMMUNITY)
Admission: AD | Admit: 2015-06-03 | Discharge: 2015-06-04 | Disposition: A | Discharge status: Home / Self Care | Payer: Medicaid Other | Source: Ambulatory Visit | Attending: Obstetrics and Gynecology | Admitting: Obstetrics and Gynecology

## 2015-06-03 DIAGNOSIS — O039 Complete or unspecified spontaneous abortion without complication: Secondary | ICD-10-CM

## 2015-06-03 DIAGNOSIS — Z3A11 11 weeks gestation of pregnancy: Secondary | ICD-10-CM | POA: Insufficient documentation

## 2015-06-03 DIAGNOSIS — Z3A00 Weeks of gestation of pregnancy not specified: Secondary | ICD-10-CM | POA: Insufficient documentation

## 2015-06-03 DIAGNOSIS — O209 Hemorrhage in early pregnancy, unspecified: Secondary | ICD-10-CM | POA: Diagnosis present

## 2015-06-03 LAB — CBC
HEMATOCRIT: 34.7 % — AB (ref 36.0–46.0)
HEMOGLOBIN: 11.7 g/dL — AB (ref 12.0–15.0)
MCH: 28.2 pg (ref 26.0–34.0)
MCHC: 33.7 g/dL (ref 30.0–36.0)
MCV: 83.6 fL (ref 78.0–100.0)
Platelets: 287 10*3/uL (ref 150–400)
RBC: 4.15 MIL/uL (ref 3.87–5.11)
RDW: 13.8 % (ref 11.5–15.5)
WBC: 9.7 10*3/uL (ref 4.0–10.5)

## 2015-06-03 NOTE — MAU Note (Signed)
Abdominal pain & vaginal spotting x 2 days. Vaginal bleeding increased today. States bleeding like a period and passing some clots.

## 2015-06-03 NOTE — MAU Provider Note (Signed)
History     CSN: [id number]  Arrival date and time: 06/03/15 2224   None     Chief Complaint  Patient presents with  . Vaginal Bleeding  . Abdominal Cramping   HPI Comments: Renee Lester is a 19 y.o. G1P0 at [redacted]w[redacted]d who presents today with vaginal bleeding. She states that she has been bleeding for a couple of days, and today it was much heavier. She had some cramping earlier, but it is better now. She has an appointment in the clinic on 06/10/15.   Vaginal Bleeding The patient's primary symptoms include vaginal bleeding. This is a new problem. The current episode started yesterday. The problem occurs constantly. The problem has been gradually worsening. The patient is experiencing no pain. She is pregnant. Pertinent negatives include no abdominal pain, constipation, diarrhea, dysuria, fever, frequency, nausea, urgency or vomiting. The vaginal discharge was bloody. The vaginal bleeding is heavier than menses. She has been passing clots. She has not been passing tissue. Nothing aggravates the symptoms. She has tried nothing for the symptoms.     No past medical history on file.  History reviewed. No pertinent past surgical history.  History reviewed. No pertinent family history.  History  Substance Use Topics  . Smoking status: Never Smoker   . Smokeless tobacco: Not on file  . Alcohol Use: No    Allergies:  Allergies  Allergen Reactions  . Coconut Oil Other (See Comments)    Any coconut product." Tongue gets bumps on it."    No prescriptions prior to admission    Review of Systems  Constitutional: Negative for fever.  Gastrointestinal: Negative for nausea, vomiting, abdominal pain, diarrhea and constipation.  Genitourinary: Positive for vaginal bleeding. Negative for dysuria, urgency and frequency.   Physical Exam   Blood pressure 127/75, pulse 82, temperature 98.5 F (36.9 C), temperature source Oral, resp. rate 18, height  (1.727 m), weight 98.703 kg (217  lb 9.6 oz), last menstrual period 03/14/2015, SpO2 100 %, not currently breastfeeding.  Physical Exam  Nursing note and vitals reviewed. Constitutional: She is oriented to person, place, and time. She appears well-developed and well-nourished. No distress.  HENT:  Head: Normocephalic.  Cardiovascular: Normal rate.   Respiratory: Effort normal.  GI: Soft. There is no tenderness. There is no rebound.  Neurological: She is alert and oriented to person, place, and time.  Skin: Skin is dry.     Results for orders placed or performed during the hospital encounter of 06/03/15 (from the past 24 hour(s))  CBC     Status: Abnormal   Collection Time: 06/03/15 11:19 PM  Result Value Ref Range   WBC 9.7 4.0 - 10.5 K/uL   RBC 4.15 3.87 - 5.11 MIL/uL   Hemoglobin 11.7 (L) 12.0 - 15.0 g/dL   HCT 11.9 (L) 14.7 - 82.9 %   MCV 83.6 78.0 - 100.0 fL   MCH 28.2 26.0 - 34.0 pg   MCHC 33.7 30.0 - 36.0 g/dL   RDW 56.2 13.0 - 86.5 %   Platelets 287 150 - 400 K/uL   US Ob Transvaginal  06/03/2015   CLINICAL DATA:  Abdominal pain and spotting for 2 days. Estimated gestational age by LMP is 11 weeks 4 days. Quantitative beta HCG is not reported.  EXAM: TRANSVAGINAL OB ULTRASOUND  TECHNIQUE: Transvaginal ultrasound was performed for complete evaluation of the gestation as well as the maternal uterus, adnexal regions, and pelvic cul-de-sac.  COMPARISON:  05/25/2015  FINDINGS: Intrauterine gestational sac: No  intrauterine gestational sac is identified.  Yolk sac:  Not identified.  Embryo:  Not identified.  Cardiac Activity: Not identified.  Maternal uterus/adnexae: The uterus is slightly anteverted. No myometrial mass lesions identified. Small nabothian cysts in the cervix. Endometrial stripe thickness is normal at 4 mm. No intrauterine gestational sac is identified. The previous study demonstrated to apparent intrauterine gestational sacs. This suggest interval loss of pregnancy. Correlation with serial beta HCG  levels is suggested.  Both ovaries are identified and appear normal. No abnormal adnexal masses. Minimal free fluid demonstrated in the pelvis.  IMPRESSION: No intrauterine gestational sac is identified. Previous study demonstrated twin gestational sacs. Findings suggest interval loss of pregnancy. Correlation with beta HCG levels is suggested.   Electronically Signed   By: Burman NievesWilliam  Stevens M.D.   On: 06/03/2015 23:52    MAU Course  Procedures  MDM   Assessment and Plan   1. SAB (spontaneous abortion)    DC home Comfort measures reviewed  SAB precautions  Bleeding precautions RX: ibuprofen 800mg  #30  Return to MAU as needed FU with OB as planned  Follow-up Information    Follow [facility] with [facility].   Specialty:  Obstetrics and Gynecology   Why:  As scheduled for 06/10/15.    Contact information:   [address] [place] [id number] [phone]        Tawnya CrookHogan, Kendall Arnell Donovan 06/04/2015, 12:05 AM

## 2015-06-03 NOTE — ED Notes (Signed)
Pt asked how long the wait was. This RN stated the longest person had been here for 2.5 hrs. She states she does not want to wait that long and is [facility]. RN made pt aware of risks and benefits of leaving. States she understands and feels safe to [facility] to [facility]. Pt ambulated out without difficulty.

## 2015-06-03 NOTE — ED Notes (Signed)
Pt reports to the ED for eval of lower abd pain cramping and vaginal bleeding. [facility] and told she had a miscarriage. Pt reports she has been passing clots. She was told to follow up if this occurred. Pt A&Ox4, resp e/u, and skin warm and dry.

## 2015-06-04 DIAGNOSIS — O039 Complete or unspecified spontaneous abortion without complication: Secondary | ICD-10-CM

## 2015-06-04 LAB — HCG, QUANTITATIVE, PREGNANCY: HCG, BETA CHAIN, QUANT, S: 7360 m[IU]/mL — AB (ref <5)

## 2015-06-04 MED ORDER — IBUPROFEN 800 MG PO TABS: 800.0000 mg | ORAL_TABLET | Freq: Three times a day (TID) | ORAL | Status: DC | PRN

## 2015-06-04 NOTE — Discharge Instructions (Signed)
Miscarriage A miscarriage is the sudden loss of an unborn baby (fetus) before the 20th week of pregnancy. Most miscarriages happen in the first 3 months of pregnancy. Sometimes, it happens before a woman even knows she is pregnant. A miscarriage is also called a "spontaneous miscarriage" or "early pregnancy loss." Having a miscarriage can be an emotional experience. Talk with your caregiver about any questions you may have about miscarrying, the grieving process, and your future pregnancy plans. CAUSES   Problems with the fetal chromosomes that make it impossible for the baby to develop normally. Problems with the baby's genes or chromosomes are most often the result of errors that occur, by chance, as the embryo divides and grows. The problems are not inherited from the parents.  Infection of the cervix or uterus.   Hormone problems.   Problems with the cervix, such as having an incompetent cervix. This is when the tissue in the cervix is not strong enough to hold the pregnancy.   Problems with the uterus, such as an abnormally shaped uterus, uterine fibroids, or congenital abnormalities.   Certain medical conditions.   Smoking, drinking alcohol, or taking illegal drugs.   Trauma.  Often, the cause of a miscarriage is unknown.  SYMPTOMS   Vaginal bleeding or spotting, with or without cramps or pain.  Pain or cramping in the abdomen or lower back.  Passing fluid, tissue, or blood clots from the vagina. DIAGNOSIS  Your caregiver will perform a physical exam. You may also have an ultrasound to confirm the miscarriage. Blood or urine tests may also be ordered. TREATMENT   Sometimes, treatment is not necessary if you naturally pass all the fetal tissue that was in the uterus. If some of the fetus or placenta remains in the body (incomplete miscarriage), tissue left behind may become infected and must be removed. Usually, a dilation and curettage (D and C) procedure is performed.  During a D and C procedure, the cervix is widened (dilated) and any remaining fetal or placental tissue is gently removed from the uterus.  Antibiotic medicines are prescribed if there is an infection. Other medicines may be given to reduce the size of the uterus (contract) if there is a lot of bleeding.  If you have Rh negative blood and your baby was Rh positive, you will need a Rh immunoglobulin shot. This shot will protect any future baby from having Rh blood problems in future pregnancies. HOME CARE INSTRUCTIONS   Your caregiver may order bed rest or may allow you to continue light activity. Resume activity as directed by your caregiver.  Have someone help with home and family responsibilities during this time.   Keep track of the number of sanitary pads you use each day and how soaked (saturated) they are. Write down this information.   Do not use tampons. Do not douche or have sexual intercourse until approved by your caregiver.   Only take over-the-counter or prescription medicines for pain or discomfort as directed by your caregiver.   Do not take aspirin. Aspirin can cause bleeding.   Keep all follow-up appointments with your caregiver.   If you or your partner have problems with grieving, talk to your caregiver or seek counseling to help cope with the pregnancy loss. Allow enough time to grieve before trying to get pregnant again.  SEEK IMMEDIATE MEDICAL CARE IF:   You have severe cramps or pain in your back or abdomen.  You have a fever.  You pass large blood clots (walnut-sized   or larger) ortissue from your vagina. Save any tissue for your caregiver to inspect.   Your bleeding increases.   You have a thick, bad-smelling vaginal discharge.  You become lightheaded, weak, or you faint.   You have chills.  MAKE SURE YOU:  Understand these instructions.  Will watch your condition.  Will get help right away if you are not doing well or get  worse. Document Released: 05/02/2001 Document Revised: 03/03/2013 Document Reviewed: 12/26/2011 ExitCare Patient Information 2015 [employer]. This information is not intended to replace advice given to you by your health care provider. Make sure you discuss any questions you have with your health care provider.  

## 2015-06-08 ENCOUNTER — Ambulatory Visit (HOSPITAL_COMMUNITY): Payer: Self-pay

## 2015-06-08 ENCOUNTER — Ambulatory Visit (HOSPITAL_COMMUNITY): Payer: Medicaid Other

## 2015-06-10 ENCOUNTER — Encounter: Payer: Self-pay | Admitting: Obstetrics & Gynecology

## 2015-12-06 IMAGING — US US OB TRANSVAGINAL
1 series · 14 of 28 positions shown · non-contrast
Comparison: None.

CLINICAL DATA: Abdominal pain during pregnancy

EXAM:
TWIN OBSTETRICAL ULTRASOUND <14 WKS

[Series 1: us ob transvaginal · 37 acquisitions, 14 frames shown]
[im 2/37]
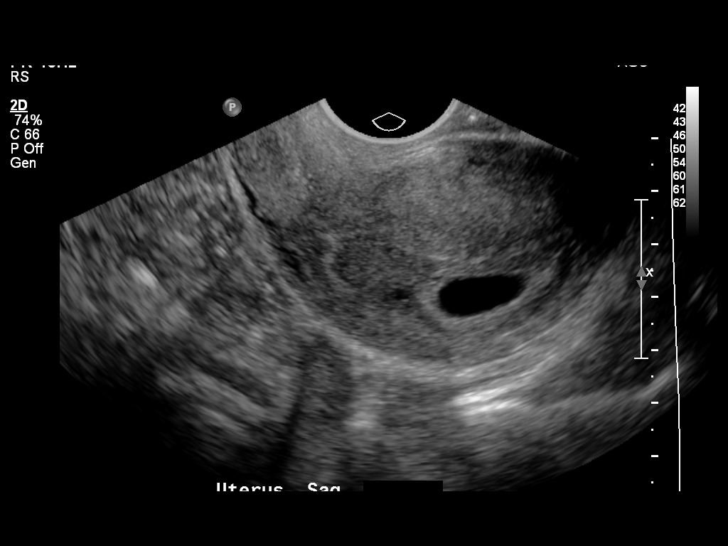
[im 5/37]
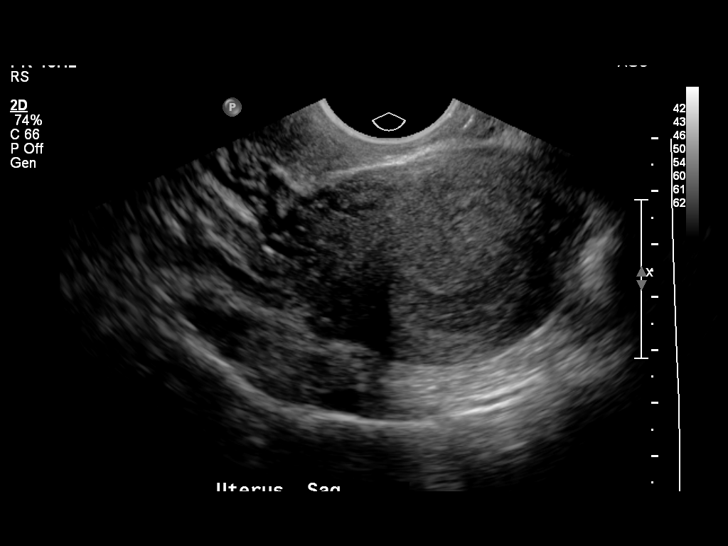
[im 7/37]
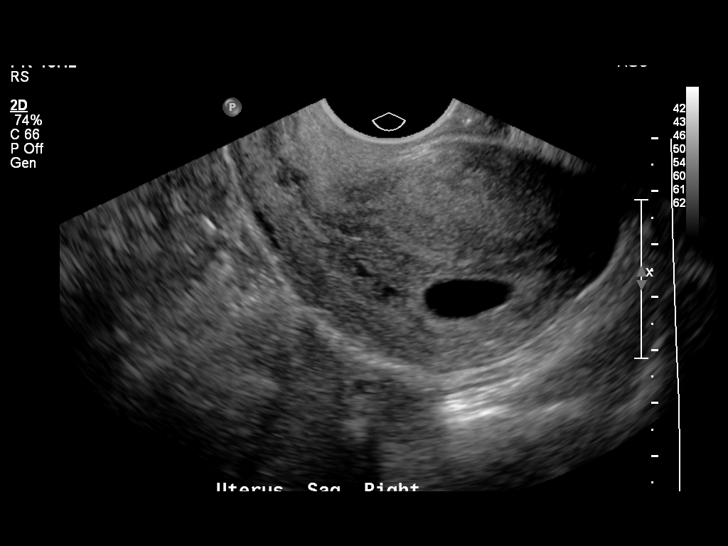
[im 10/37]
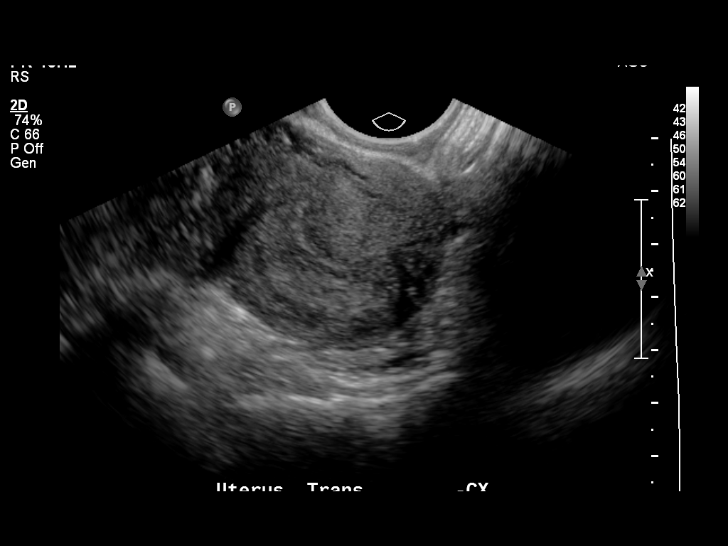
[im 13/37]
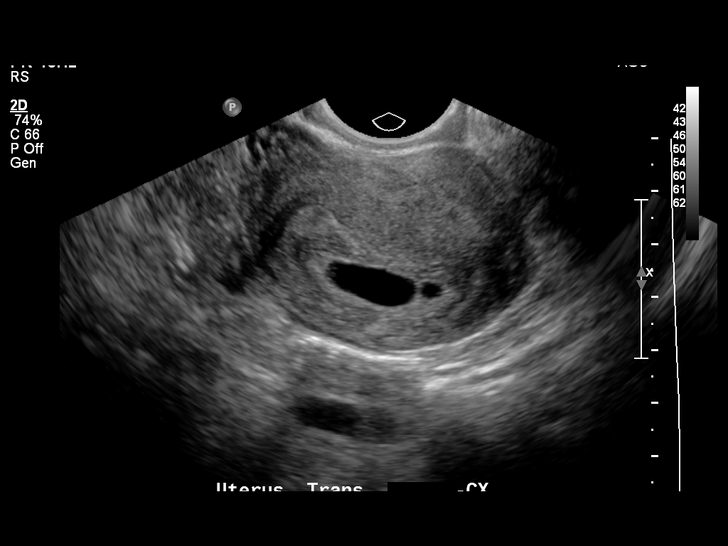
[im 15/37]
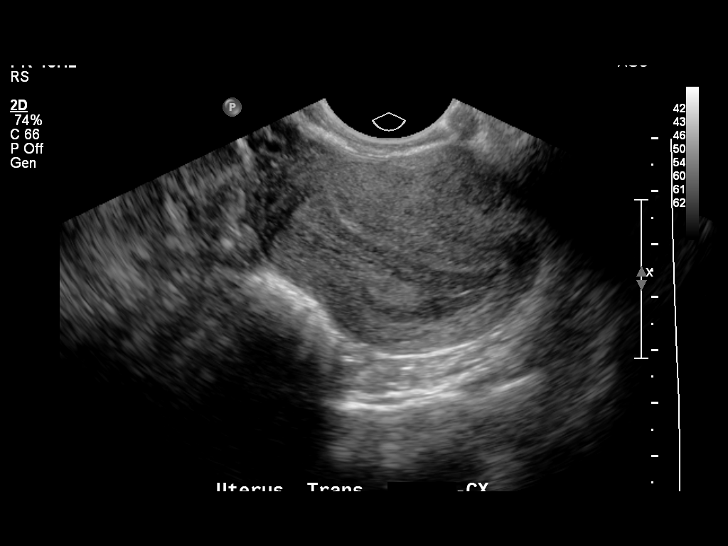
[im 18/37]
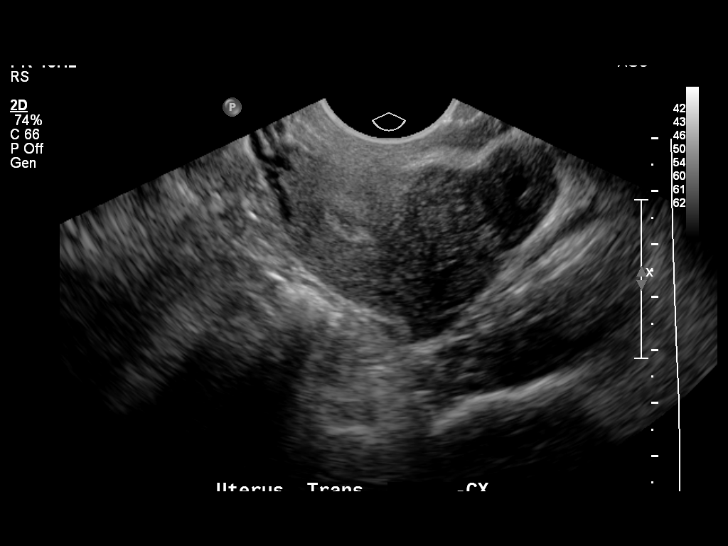
[im 21/37]
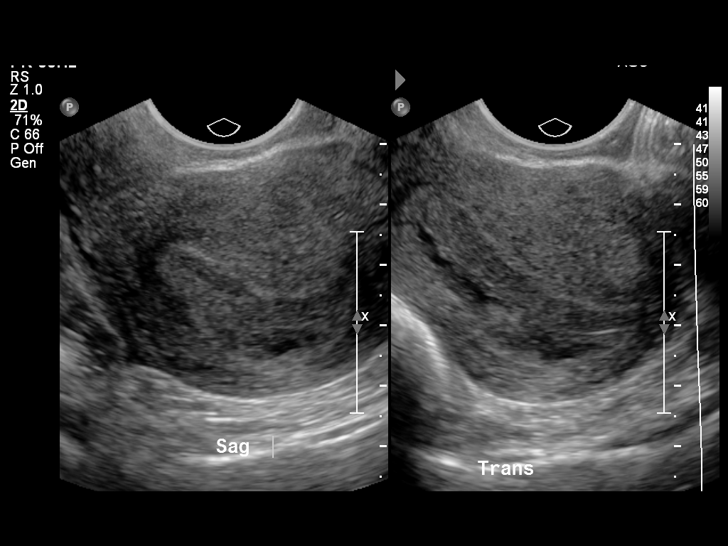
[im 23/37]
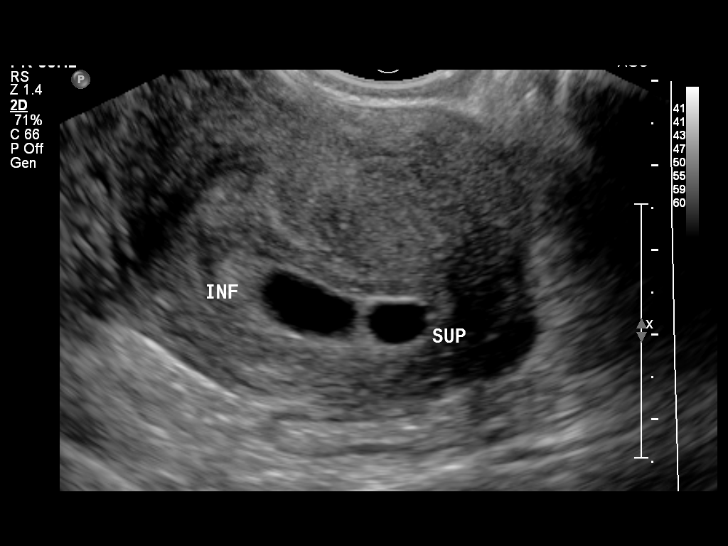
[im 26/37]
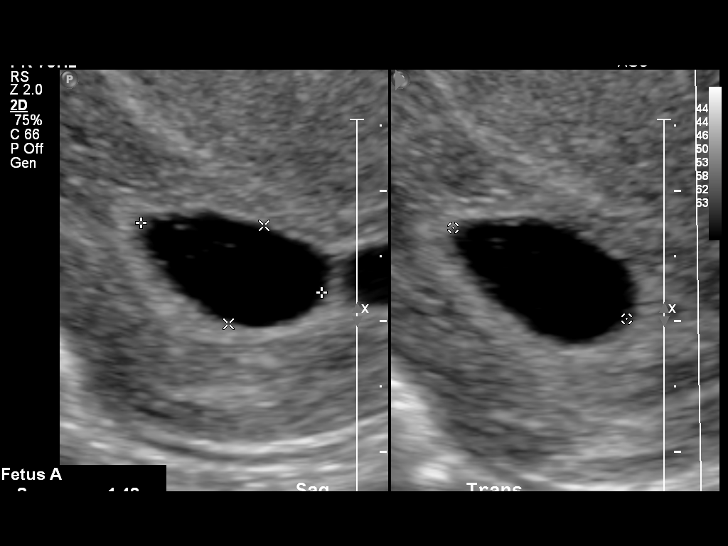
[im 29/37]
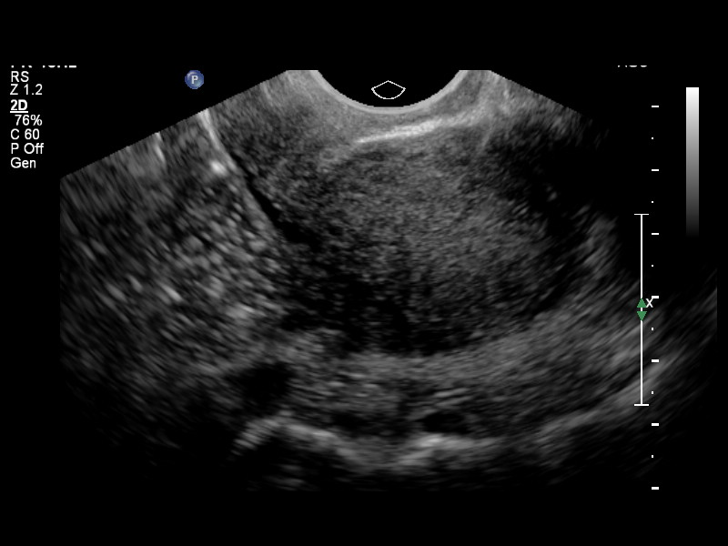
[im 31/37]
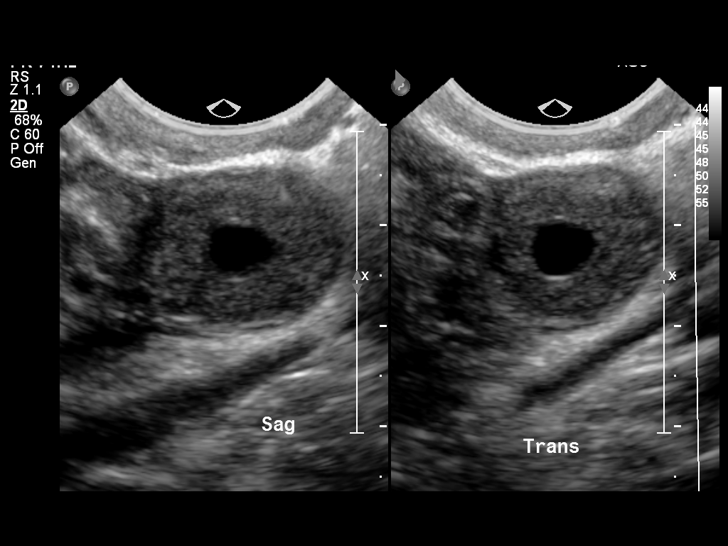
[im 34/37]
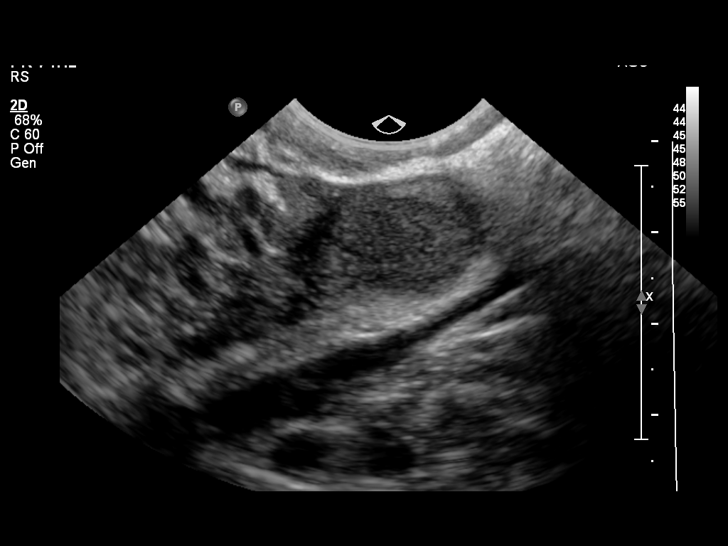
[im 37/37]
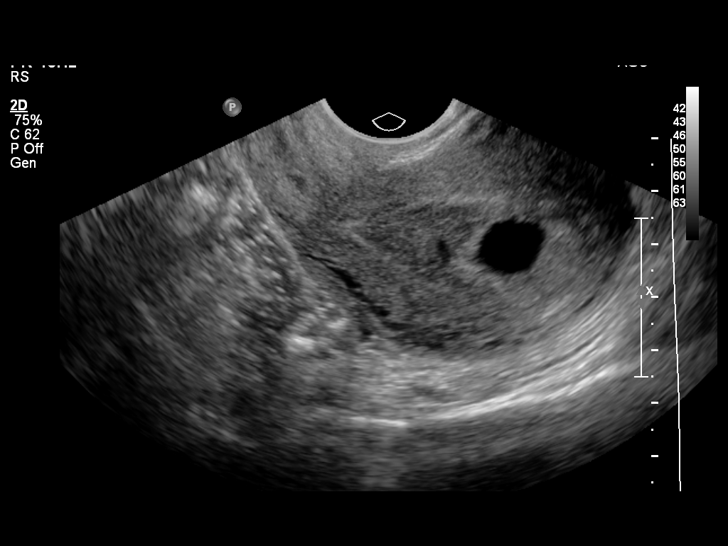

[14 of 28 positions shown; findings below may reference images not displayed]

FINDINGS: TWIN 1

Intrauterine gestational sac: Two adjacent intrauterine gestational
sacs again visualize.

Yolk sac:  No

Embryo:  No

Cardiac Activity: No

Heart Rate: Not applicable bpm

MSD #1: 12.6  mm   6 w   0  d

MSD #2:  7.1 mm   5 w 2 d

Maternal uterus/adnexae:

Subchorionic hemorrhage: Moderate subchorionic hemorrhage. This
measures 3 x 1.1 x 3.9 cm.

Right ovary: Normal

Left ovary: Normal

Other :Some internal echoes identified within sac #1

Free fluid:  None.
IMPRESSION: 1. Again noted are 2 adjacent intrauterine gestational sacs. These
demonstrate mild increase in size from previous exam. However, no
visualize yolk sac, embryo in either sac. Findings remain suspicious
but not yet definitive for failed pregnancy. Recommend follow-up US
in 10-14 days for definitive diagnosis. This recommendation follows
SRU consensus guidelines: Diagnostic Criteria for Nonviable
Pregnancy Early in the First Trimester. N Engl J Med 8149;
2. Moderate size subchorionic hemorrhage

## 2015-12-15 IMAGING — US US OB TRANSVAGINAL
1 series · 13 of 28 positions shown · non-contrast
Comparison: 05/25/2015

CLINICAL DATA: Abdominal pain and spotting for 2 days. Estimated
gestational age by LMP is 11 weeks 4 days. Quantitative beta HCG is
not reported.

EXAM:
TRANSVAGINAL OB ULTRASOUND
TECHNIQUE: Transvaginal ultrasound was performed for complete evaluation of the
gestation as well as the maternal uterus, adnexal regions, and
pelvic cul-de-sac.

[Series 1: us ob transvaginal · 13 of 36 slices shown]
[im 2/36]
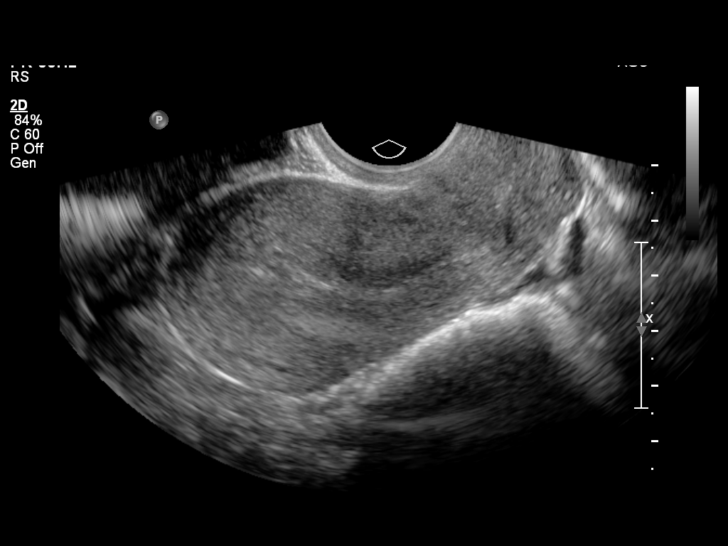
[im 4/36]
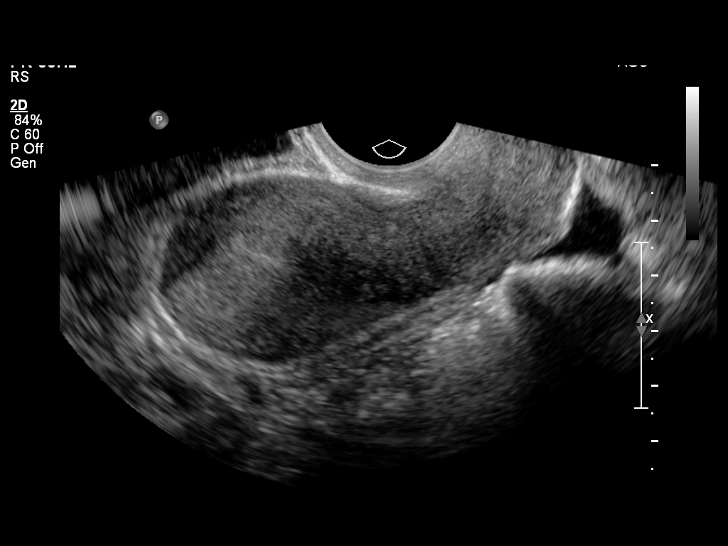
[im 7/36]
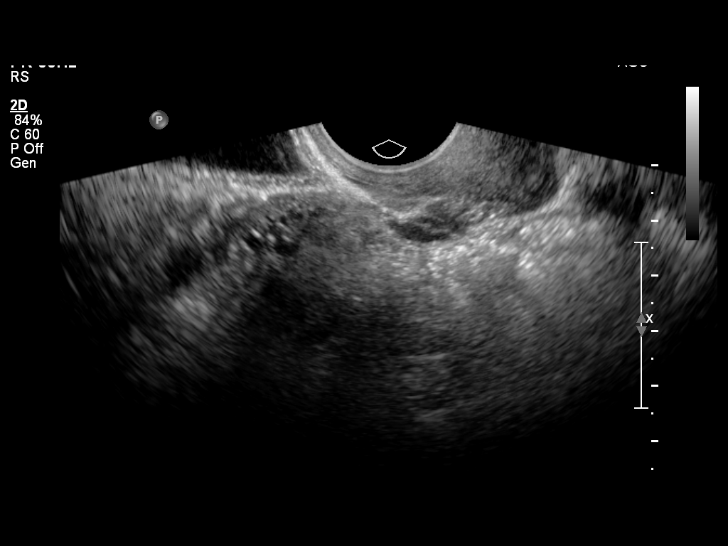
[im 10/36]
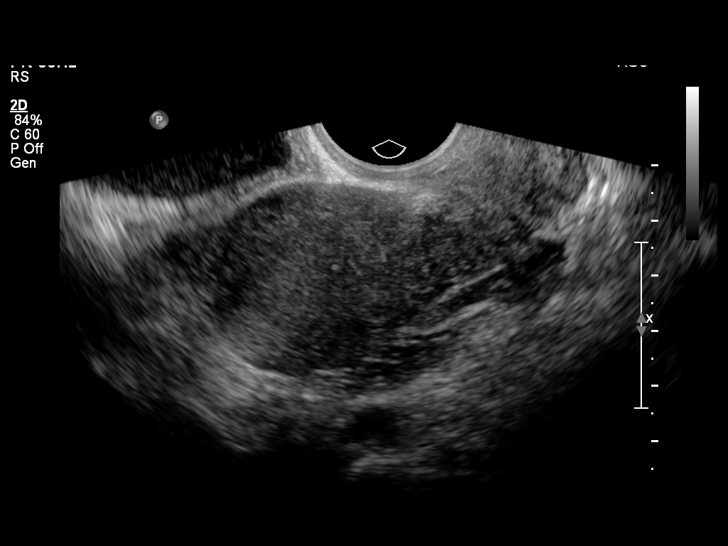
[im 12/36]
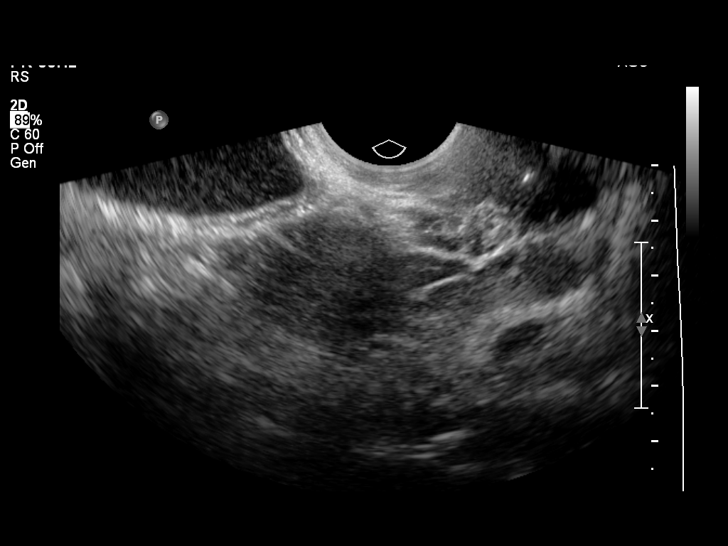
[im 15/36]
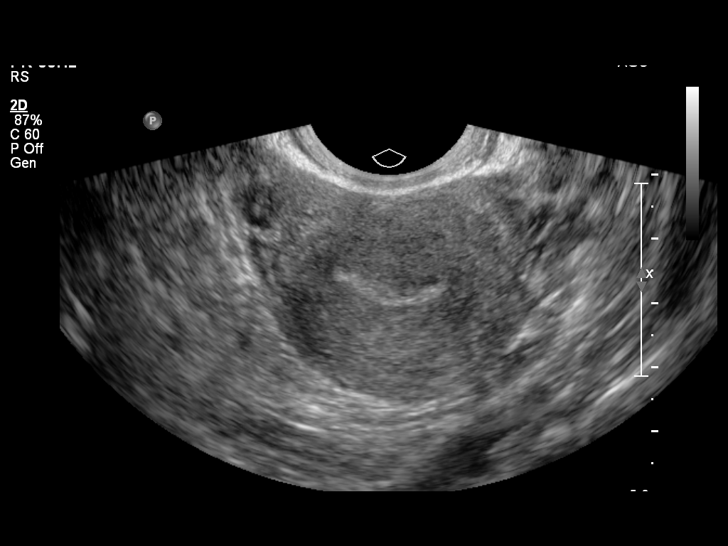
[im 19/36]
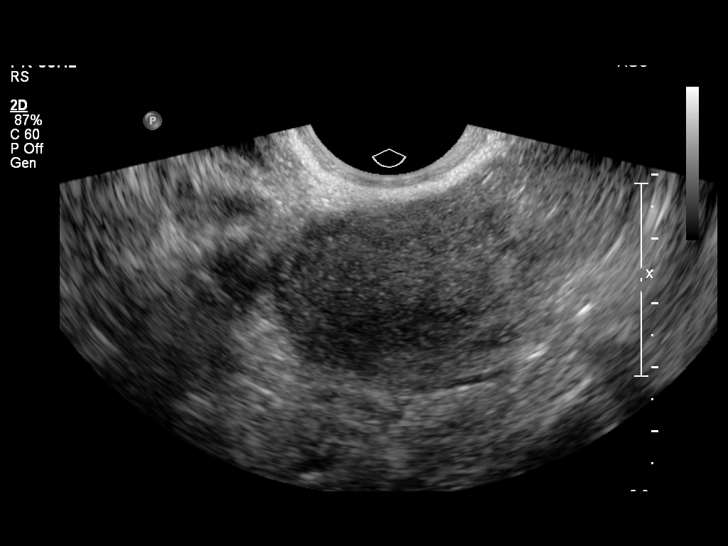
[im 21/36]
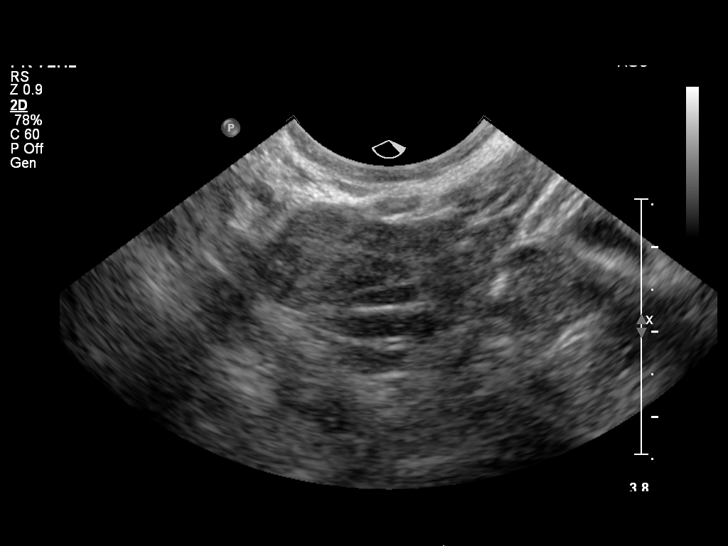
[im 24/36]
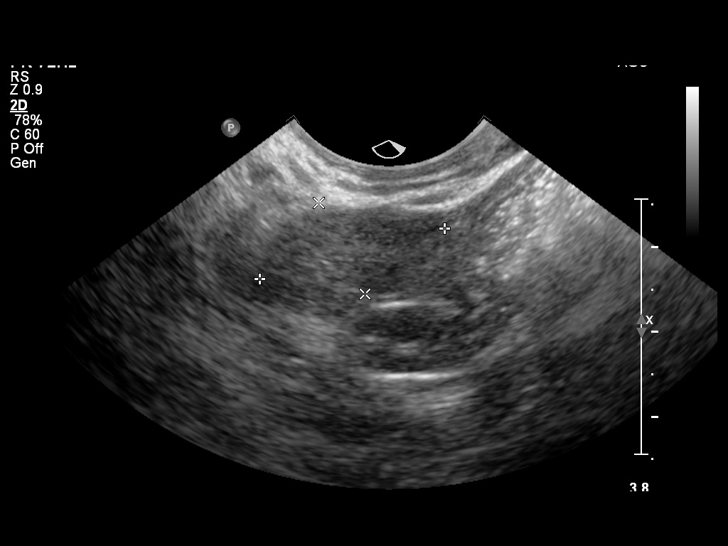
[im 26/36]
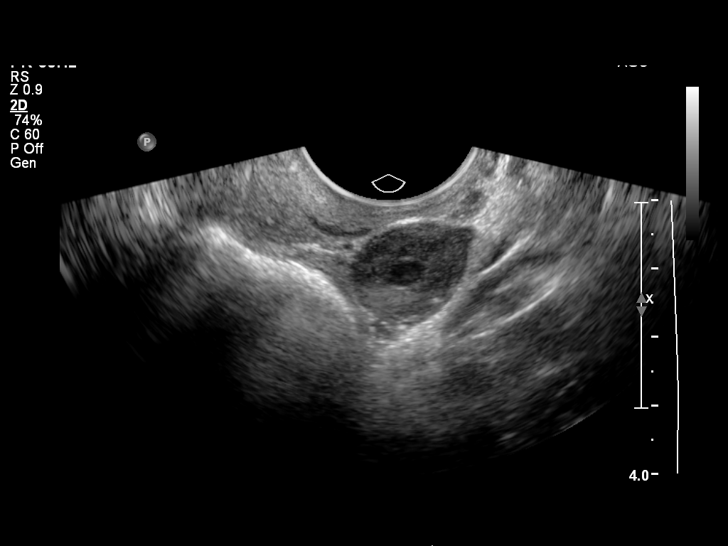
[im 29/36]
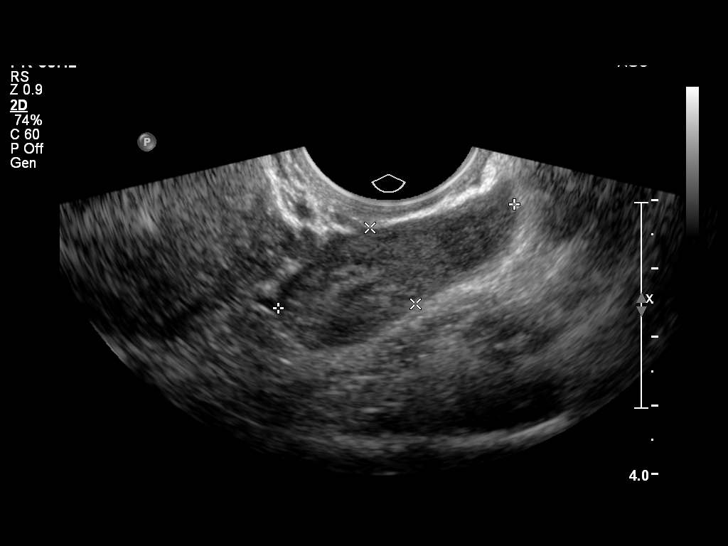
[im 32/36]
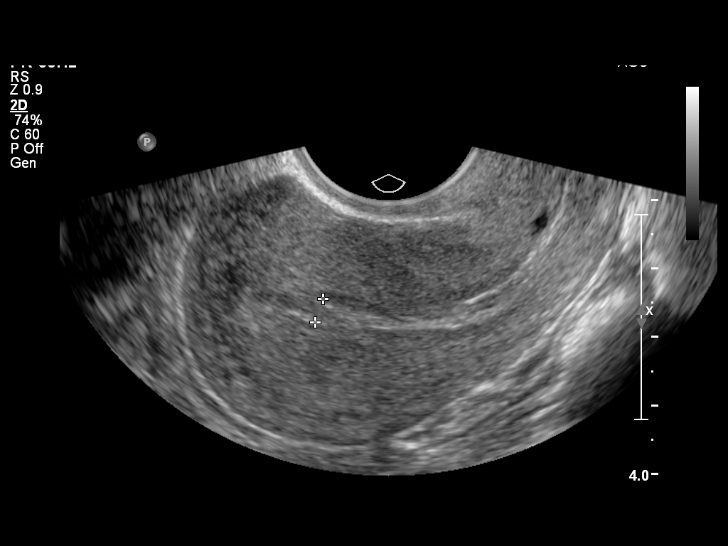
[im 34/36]
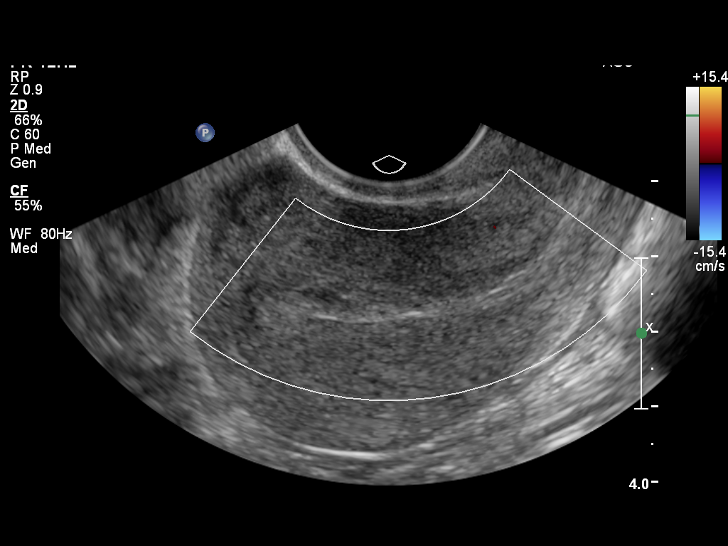

[13 of 28 positions shown; findings below may reference images not displayed]

FINDINGS: Intrauterine gestational sac: No intrauterine gestational sac is
identified.

Yolk sac:  Not identified.

Embryo:  Not identified.

Cardiac Activity: Not identified.

Maternal uterus/adnexae: The uterus is slightly anteverted. No
myometrial mass lesions identified. Small nabothian cysts in the
cervix. Endometrial stripe thickness is normal at 4 mm. No
intrauterine gestational sac is identified. The previous study
demonstrated to apparent intrauterine gestational sacs. This suggest
interval loss of pregnancy. Correlation with serial beta HCG levels
is suggested.

Both ovaries are identified and appear normal. No abnormal adnexal
masses. Minimal free fluid demonstrated in the pelvis.
IMPRESSION: No intrauterine gestational sac is identified. Previous study
demonstrated twin gestational sacs. Findings suggest interval loss
of pregnancy. Correlation with beta HCG levels is suggested.

## 2016-02-28 ENCOUNTER — Encounter (HOSPITAL_COMMUNITY): Payer: Self-pay | Admitting: *Deleted

## 2018-06-27 ENCOUNTER — Encounter

## 2018-07-08 ENCOUNTER — Emergency Department (HOSPITAL_COMMUNITY)
Admission: EM | Admit: 2018-07-08 | Discharge: 2018-07-08 | Disposition: A | Discharge status: Home / Self Care | Payer: BLUE CROSS/BLUE SHIELD | Attending: Emergency Medicine | Admitting: Emergency Medicine

## 2018-07-08 ENCOUNTER — Encounter (HOSPITAL_COMMUNITY): Payer: Self-pay | Admitting: Emergency Medicine

## 2018-07-08 DIAGNOSIS — J029 Acute pharyngitis, unspecified: Secondary | ICD-10-CM

## 2018-07-08 DIAGNOSIS — N939 Abnormal uterine and vaginal bleeding, unspecified: Secondary | ICD-10-CM

## 2018-07-08 DIAGNOSIS — N938 Other specified abnormal uterine and vaginal bleeding: Secondary | ICD-10-CM | POA: Insufficient documentation

## 2018-07-08 DIAGNOSIS — N3001 Acute cystitis with hematuria: Secondary | ICD-10-CM | POA: Diagnosis not present

## 2018-07-08 DIAGNOSIS — Z79899 Other long term (current) drug therapy: Secondary | ICD-10-CM | POA: Diagnosis not present

## 2018-07-08 DIAGNOSIS — N921 Excessive and frequent menstruation with irregular cycle: Secondary | ICD-10-CM | POA: Diagnosis present

## 2018-07-08 LAB — WET PREP, GENITAL
Sperm: NONE SEEN
Trich, Wet Prep: NONE SEEN
Yeast Wet Prep HPF POC: NONE SEEN

## 2018-07-08 LAB — URINALYSIS, ROUTINE W REFLEX MICROSCOPIC
Bilirubin Urine: NEGATIVE
Glucose, UA: NEGATIVE mg/dL
Ketones, ur: NEGATIVE mg/dL
NITRITE: NEGATIVE
Protein, ur: 30 mg/dL — AB
Specific Gravity, Urine: 1.024 (ref 1.005–1.030)
pH: 5 (ref 5.0–8.0)

## 2018-07-08 LAB — POC URINE PREG, ED: PREG TEST UR: NEGATIVE

## 2018-07-08 MED ORDER — CEPHALEXIN 500 MG PO CAPS: 500.0000 mg | ORAL_CAPSULE | Freq: Two times a day (BID) | ORAL | 0 refills | Status: DC

## 2018-07-08 NOTE — Discharge Instructions (Signed)
Please read attached information. If you experience any new or worsening signs or symptoms please return to the emergency room for evaluation. Please follow-up with your primary care provider or specialist as discussed. Please use medication prescribed only as directed and discontinue taking if you have any concerning signs or symptoms.   °

## 2018-07-08 NOTE — ED Triage Notes (Signed)
Pt to ER for evaluation of multiple complaints nasal congestion, sore throat x3 weeks, also reports irregular periods. States had a normal menstrual cycle occur in April, since then has not had a "normal" one, also reports is currently bleeding "but not a lot" and is having lower abd pain.

## 2018-07-08 NOTE — ED Provider Notes (Addendum)
[facility] EMERGENCY DEPARTMENT Provider Note   CSN: [id number] Arrival date & time: 07/08/18  1731    History   Chief Complaint Chief Complaint  Patient presents with  . Metrorrhagia    HPI Renee Lester is a 22 y.o. female.  HPI   [age] YOF presents today with numerous complaints. Patient notes that she had emotional cycle in May, again on June 6, July 29 she had another menstrual cycle and has had small amount of bleeding since. She denies any brisk bleeding, she reports minor suprapubic abdominal pain, denies any vaginal discharge. She notes she is sexually active with men. Patient reports very minimal diffuse upper abdominal pain occasionally and also notes she has been constipated recently. She also notes that she's had a sore throat for 3 weeks, worse in the morning, with no difficulty swallowing breathing, fever or neck stiffness. Patient notes she has seen her primary care follow-up as complaints, was started on Claritin, told that her menstrual cycle was missed secondary to stress, she has scheduled follow-up evaluation  With her OB/GYN.she notes approximately one month ago she had a urinary tract infection that was treated with an unknown antibiotic. Patient notes increased urine frequency but denies any dysuria.   History reviewed. No pertinent past medical history.  There are no active problems to display for this patient.   History reviewed. No pertinent surgical history.   OB History    Gravida  1   Para      Term      Preterm      AB      Living        SAB      TAB      Ectopic      Multiple  0   Live Births               Home Medications    Prior to Admission medications   Medication Sig Start Date End Date Taking? Authorizing Provider  fexofenadine (ALLEGRA) 180 MG tablet Take 180 mg by mouth daily.   Yes [provider]  ibuprofen (ADVIL,MOTRIN) 200 MG tablet Take 400 mg by mouth every 6 (six) hours as  needed for fever, headache, mild pain, moderate pain or cramping.   Yes [provider]  cephALEXin (KEFLEX) 500 MG capsule Take 1 capsule (500 mg total) by mouth 2 (two) times daily. 07/08/18   Mekaila Tarnow, Tinnie GensJeffrey, PA-C  ibuprofen (ADVIL,MOTRIN) 800 MG tablet Take 1 tablet (800 mg total) by mouth every 8 (eight) hours as needed. Patient not taking: Reported on 07/08/2018 06/04/15   Armando ReichertHogan, Heather D, CNM    Family History History reviewed. No pertinent family history.  Social History Social History   Tobacco Use  . Smoking status: Never Smoker  . Smokeless tobacco: Never Used  Substance Use Topics  . Alcohol use: No  . Drug use: No     Allergies   Patient has no known allergies.   Review of Systems Review of Systems  All other systems reviewed and are negative.   Physical Exam Updated Vital Signs BP 123/79 (BP Location: Left Arm)   Pulse 83   Temp 98.3 F (36.8 C) (Oral)   Resp 15   SpO2 100%    Physical Exam  Constitutional: She is oriented to person, place, and time. She appears well-developed and well-nourished.  HENT:  Head: Normocephalic and atraumatic.  Oropharynx clear with no swelling or edema, uvula is midline no pooling secretions  Eyes: Pupils are equal, round, and reactive to light. Conjunctivae are normal. Right eye exhibits no discharge. Left eye exhibits no discharge. No scleral icterus.  Neck: Normal range of motion. No JVD present. No tracheal deviation present.  Pulmonary/Chest: Effort normal. No stridor.  Abdominal:  Very minimal suprapubic tenderness palpation- remainder abdomen soft nontender  Genitourinary:  Genitourinary Comments: Small amount of red blood noted in the vaginal vault coming from the cervix, no other lesions or irritations noted, no brisk bleeding, no cervical motion or adnexal tenderness or masses  Neurological: She is alert and oriented to person, place, and time. Coordination normal.  Psychiatric: She has a normal mood  and affect. Her behavior is normal. Judgment and thought content normal.  Nursing note and vitals reviewed.    ED Treatments / Results  Labs (all labs ordered are listed, but only abnormal results are displayed) Labs Reviewed  WET PREP, GENITAL - Abnormal; Notable for the following components:      Result Value   Clue Cells Wet Prep HPF POC PRESENT (*)    WBC, Wet Prep HPF POC FEW (*)    All other components within normal limits  URINALYSIS, ROUTINE W REFLEX MICROSCOPIC - Abnormal; Notable for the following components:   APPearance CLOUDY (*)    Hgb urine dipstick LARGE (*)    Protein, ur 30 (*)    Leukocytes, UA LARGE (*)    WBC, UA >50 (*)    Bacteria, UA FEW (*)    All other components within normal limits  URINE CULTURE  POC URINE PREG, ED  GC/CHLAMYDIA PROBE AMP ([place]) NOT AT [facility]    EKG None  Radiology No results found.  Procedures Procedures (including critical care time)  Medications Ordered in ED Medications - No data to display   Initial Impression / Assessment and Plan / ED Course  I have reviewed the triage vital signs and the nursing notes.  Pertinent labs & imaging results that were available during my care of the patient were reviewed by me and considered in my medical decision making (see chart for details).     Labs: wet prep, GC, urinalysis, urine culture, urine pregnant  Imaging:  Consults:  Therapeutics:  Discharge Meds: Keflex  Assessment/Plan: 22 year old female presents today with several complaints. Patient's upper respiratory complaints likely viral in nature, she is encouraged  to use antihistamines no signs of infectious etiology. Patient having ongoing vaginal bleeding, she is not pregnant, no signs of infection, low suspicion for ovarian torsion or significant intrapelvic pathology. Patient does have urine consistent with urinary tract infection along with increased urinary frequency and suprapubic pain, she will be treated  for urinary tract infection. Patient is encouraged follow-up with her OB/GYN for vaginal bleeding, follow-up with primary care for upper respiratory complaints. She is given strict return precautions, verbalized understanding and agreement to today's plan had no further questions or concerns.   Final Clinical Impressions(s) / ED Diagnoses   Final diagnoses:  Sore throat  Acute cystitis with hematuria  Abnormal uterine bleeding    ED Discharge Orders         Ordered    cephALEXin (KEFLEX) 500 MG capsule  2 times daily     07/08/18 2140           Eyvonne MechanicHedges, Abdulhamid Olgin, PA-C 07/08/18 2145    Eyvonne MechanicHedges, Avrom Robarts, PA-C 07/08/18 2147    Virgina Norfolkuratolo, Adam, DO 07/08/18 2337

## 2018-07-09 LAB — GC/CHLAMYDIA PROBE AMP (~~LOC~~) NOT AT ARMC
Chlamydia: NEGATIVE
Neisseria Gonorrhea: NEGATIVE

## 2018-07-11 LAB — URINE CULTURE: Culture: >=100000 — AB

## 2018-07-12 ENCOUNTER — Telehealth: Payer: Self-pay

## 2018-07-12 NOTE — Telephone Encounter (Signed)
Post ED Visit - Positive Culture Follow-up: Unsuccessful Patient Follow-up  Culture assessed and recommendations reviewed by:  []  Enzo BiNathan Batchelder, Pharm.D. []  Celedon io MiyamotoJeremy Frens, Pharm.D., BCPS AQ-ID []  Garvin FilaMike Maccia, Pharm.D., BCPS []  Georgina PillionElizabeth Martin, Pharm.D., BCPS []  St. MartinMinh Pham, [place]arm.D., BCPS, AAHIVP []  Estella HuskMichelle Turner, Pharm.D., BCPS, AAHIVP []  Sherlynn CarbonAustin Lucas, PharmD []  Pollyann SamplesAndy Johnston, PharmD, BCPS Hailey Francoise SchaumannBaird Pharm D Positive urine culture  []  Patient discharged without antimicrobial prescription and treatment is now indicated [x]  Organism is resistant to prescribed ED discharge antimicrobial []  Patient with positive blood cultures   Unable to contact patient after 3 attempts, letter will be sent to address on file  Jerry CarasCullom, Arran Fessel Burnett 07/12/2018, 10:57 AM

## 2018-07-12 NOTE — Progress Notes (Signed)
ED Antimicrobial Stewardship Positive Culture Follow Up   Renee Lester is an 22 y.o. female who [facility] with a chief complaint of  Chief Complaint  Patient presents with  . Metrorrhagia    Recent Results (from the past 720 hour(sHeloise Beecham))  Wet prep, genital     Status: Abnormal   Collection Time: 07/08/18  9:00 PM  Result Value Ref Range Status   Yeast Wet Prep HPF POC NONE SEEN NONE SEEN Final   Trich, Wet Prep NONE SEEN NONE SEEN Final   Clue Cells Wet Prep HPF POC PRESENT (A) NONE SEEN Final   WBC, Wet Prep HPF POC FEW (A) NONE SEEN Final   Sperm NONE SEEN  Final    Comment: Performed at [facility], [address] [place], [place] [address]  Urine culture     Status: Abnormal   Collection Time: 07/08/18  9:17 PM  Result Value Ref Range Status   Specimen Description URINE, CLEAN CATCH  Final   Special Requests   Final    NONE Performed at [facility], [address] [place], [place] [address]    Culture (A)  Final    >=100,000 COLONIES/mL METHICILLIN RESISTANT STAPHYLOCOCCUS AUREUS   Report Status 07/11/2018 FINAL  Final   Organism ID, Bacteria METHICILLIN RESISTANT STAPHYLOCOCCUS AUREUS (A)  Final      Susceptibility   Methicillin resistant staphylococcus aureus - MIC*    CIPROFLOXACIN >=8 RESISTANT Resistant     GENTAMICIN <=0.5 SENSITIVE Sensitive     NITROFURANTOIN <=16 SENSITIVE Sensitive     OXACILLIN >=4 RESISTANT Resistant     TETRACYCLINE <=1 SENSITIVE Sensitive     VANCOMYCIN <=0.5 SENSITIVE Sensitive     TRIMETH/SULFA <=10 SENSITIVE Sensitive     CLINDAMYCIN RESISTANT Resistant     RIFAMPIN <=0.5 SENSITIVE Sensitive     Inducible Clindamycin POSITIVE Resistant     * >=100,000 COLONIES/mL METHICILLIN RESISTANT STAPHYLOCOCCUS AUREUS    [x]  Treated with Keflex, organism resistant to prescribed antimicrobial  New antibiotic prescription: Stop Keflex. Start Bactrim 1 DS tablet BID x 5 days. Have Flow Manager call  patient - encourage close follow-up with PCP and have patient come back to the ED if experiencing fevers or chills.  ED Provider: Graciella FreerLindsey Layden, PA-C   Babs BertinBaird, [name] P 07/12/2018, 10:05 AM Clinical Pharmacist Monday - Friday phone -  [phone] Saturday - Sunday phone [phone]

## 2018-09-23 ENCOUNTER — Telehealth: Payer: Self-pay | Admitting: Emergency Medicine

## 2018-09-23 NOTE — Telephone Encounter (Signed)
Lost to followup 

## 2018-10-11 ENCOUNTER — Ambulatory Visit: Payer: BLUE CROSS/BLUE SHIELD | Admitting: Family Medicine

## 2018-11-26 ENCOUNTER — Other Ambulatory Visit: Payer: Self-pay | Admitting: Obstetrics & Gynecology

## 2018-11-26 DIAGNOSIS — E282 Polycystic ovarian syndrome: Secondary | ICD-10-CM | POA: Insufficient documentation

## 2018-12-01 NOTE — Progress Notes (Signed)
  Subjective:   Patient ID: Renee Lester    DOB: 1996-04-18, 23 y.o. female   MRN: [id number]  SYMPHANY Lester is a 23 y.o. female with no significant PMH here for   Establish Care with new PCP - established with [facility], undergoing w/u for PCOS. Not currently on meds. - previously seen by [facility]. - currently taking flagyl for BV, 2 days left - dad - HTN. No other pertinent FH. - no flu shot this year, doesn't want - lives at home with boyfriend - starts new job next Monday at [employer]. - Non smoker. Drinks one liquor drink once per month. No illicit drug use.  Frequent Urination - urinates more frequently, no dysuria. Sometimes will have back and abdominal pain, usually around the time of her menses. - No blood in urine. - no FH of diabetes.  Reproductive History - G1P0020 - Menses: irregular - Contraaception: depo - Cancer screening: due for pap smear  Review of Systems:  Per HPI.  PMFSH, medications and smoking status reviewed.  Objective:   BP 102/60   Pulse 84   Temp 98.5 F (36.9 C) (Oral)   Ht 5\' 8"  (1.727 m)   Wt 267 lb (121.1 kg)   LMP 07/17/2018 Comment: PCOS per OB/GYN  SpO2 98%   BMI 40.60 kg/m  Vitals and nursing note reviewed.  General: obese female, in no acute distress with non-toxic appearance CV: regular rate and rhythm without murmurs, rubs, or gallops Lungs: clear to auscultation bilaterally with normal work of breathing Abdomen: soft, non-tender, normoactive bowel sounds Skin: warm, dry, no rashes or lesions Extremities: warm and well perfused, normal tone MSK: ROM grossly intact, strength intact, gait normal Neuro: Alert and oriented, speech normal  Assessment & Plan:   Establish Care with PCP Reviewed PMFSH and medications and updated in EMR. Counseled on flu vaccine, declined. Patient will go to OB/GYN for pap smear. Counseling provided regarding healthy lifestyle through diet and exercise.  Increased urinary  frequency Has had h/o UTI in the past, feels similar to prior infections though no dysuria. Will obtain U/A and A1c given PCOS and obesity and call with results.  Class 3 severe obesity due to excess calories without serious comorbidity with body mass index (BMI) of 40.0 to 44.9 in adult [facility]) Counseling provided regarding healthy lifestyle through diet and exercise, handout provided. May have benefit from adding metformin for PCOS, will obtain A1c.  Orders Placed This Encounter  Procedures  . POCT glycosylated hemoglobin (Hb A1C)  . POCT urinalysis dipstick   No orders of the defined types were placed in this encounter.   Ellwood Dense, DO PGY-2, [facility] 12/02/2018 9:11 AM

## 2018-12-02 ENCOUNTER — Ambulatory Visit (INDEPENDENT_AMBULATORY_CARE_PROVIDER_SITE_OTHER): Payer: BLUE CROSS/BLUE SHIELD | Admitting: Family Medicine

## 2018-12-02 ENCOUNTER — Encounter: Payer: Self-pay | Admitting: Family Medicine

## 2018-12-02 ENCOUNTER — Other Ambulatory Visit: Payer: Self-pay

## 2018-12-02 VITALS — BP 102/60 | HR 84 | Temp 98.5°F | Ht 68.0 in | Wt 267.0 lb

## 2018-12-02 DIAGNOSIS — R350 Frequency of micturition: Secondary | ICD-10-CM

## 2018-12-02 DIAGNOSIS — Z6841 Body Mass Index (BMI) 40.0 and over, adult: Secondary | ICD-10-CM

## 2018-12-02 DIAGNOSIS — Z7689 Persons encountering health services in other specified circumstances: Secondary | ICD-10-CM | POA: Diagnosis not present

## 2018-12-02 HISTORY — DX: Frequency of micturition: R35.0

## 2018-12-02 LAB — POCT URINALYSIS DIP (MANUAL ENTRY)
BILIRUBIN UA: NEGATIVE
Glucose, UA: NEGATIVE mg/dL
Ketones, POC UA: NEGATIVE mg/dL
NITRITE UA: NEGATIVE
PH UA: 7 (ref 5.0–8.0)
PROTEIN UA: NEGATIVE mg/dL
Spec Grav, UA: 1.02 (ref 1.010–1.025)
UROBILINOGEN UA: 0.2 U/dL

## 2018-12-02 LAB — POCT GLYCOSYLATED HEMOGLOBIN (HGB A1C): Hemoglobin A1C: 5.1 % (ref 4.0–5.6)

## 2018-12-02 NOTE — Assessment & Plan Note (Signed)
Has had h/o UTI in the past, feels similar to prior infections though no dysuria. Will obtain U/A and A1c given PCOS and obesity and call with results.

## 2018-12-02 NOTE — Assessment & Plan Note (Signed)
Counseling provided regarding healthy lifestyle through diet and exercise, handout provided. May have benefit from adding metformin for PCOS, will obtain A1c.

## 2018-12-02 NOTE — Patient Instructions (Signed)
It was great to see you!  Our plans for today:  - We are checking some labs today, we will call you or send you a letter if they are abnormal.  - Be sure to mention need for a pap smear to your OB/GYN. - See below for tips on a healthy lifestyle through healthy eating and activity. - Follow up as needed or in one year.  Take care and seek immediate care sooner if you develop any concerns.   Dr. [facility]  Here is an example of what a healthy plate looks like:    ? Make half your plate fruits and vegetables.     ? Focus on whole fruits.     ? Vary your veggies.  ? Make half your grains whole grains. -     ? Look for the word "whole" at the beginning of the ingredients list    ? Some whole-grain ingredients include whole oats, whole-wheat flour,        whole-grain corn, whole-grain brown rice, and whole rye.  ? Move to low-fat and fat-free milk or yogurt.  ? Vary your protein routine. - Meat, fish, poultry (chicken, [place]), eggs, beans (kidney, pinto), dairy.  ? Drink and eat less sodium, saturated fat, and added sugars.  Look for opportunities to move your body throughout your day:  Never lie down when you can sit; never sit when you can stand; never stand when you can pace.  Moving your body throughout the day is just as important as the 30 or 60 minutes of exercise at the gym!  Get social Get active with your friends instead of going out to eat. Go for a hike, walk around the mall, or play an exercise-themed video game.   Move more at work Fit more activity into the workday. Stand during phone calls, use a printer farther from your desk, and get up to stretch each hour.    Do something new Develop a new skill to kick-start your motivation. Sign up for a class to learn how to [employer], surf, do tai chi, or play a sport.    Keep cool in the pool Don't like to sweat? Hit the local community pool for a swim, water polo, or water aerobics class to  stay cool while exercising.    Stay on track Use a fitness tracker (FITBIT, Fitness Pal mobile app) to track your activity and provide motivation to reach your goals.

## 2019-02-19 ENCOUNTER — Telehealth (INDEPENDENT_AMBULATORY_CARE_PROVIDER_SITE_OTHER): Payer: BLUE CROSS/BLUE SHIELD | Admitting: Family Medicine

## 2019-02-19 ENCOUNTER — Other Ambulatory Visit: Payer: Self-pay

## 2019-02-19 DIAGNOSIS — R197 Diarrhea, unspecified: Secondary | ICD-10-CM

## 2019-02-19 NOTE — Progress Notes (Signed)
  [facility] Telemedicine Visit  Patient consented to have visit conducted via telephone.  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   ROS: no fever, no cough, no vomiting, no rash Endorses chills, sneezing  Pertinent PMHx: none  Exam:  Respiratory: speaks in full sentences  Assessment/Plan:  1. Diarrhea, unspecified type History consistent with a viral gastroenteritis. Cramping before having loose bowel movement. Discussed staying hydrated, BRAT diet. She is out of work currently but told her to call us for work note if they ask her to come back in the next several days as I would keep her out of work until she feels back to her normal self. Reasons to call back reviewed including worsening, fevers, inability to tolerate PO. Patient verbalized understanding and agreement with plan.    Time spent on phone with patient: 8 minutes  Dolores Patty, DO PGY-3, [facility] 02/19/2019 2:52 PM

## 2019-09-22 ENCOUNTER — Ambulatory Visit (HOSPITAL_COMMUNITY)
Admission: EM | Admit: 2019-09-22 | Discharge: 2019-09-22 | Disposition: A | Discharge status: Home / Self Care | Payer: BC Managed Care – PPO | Attending: Family Medicine | Admitting: Family Medicine

## 2019-09-22 ENCOUNTER — Other Ambulatory Visit: Payer: Self-pay

## 2019-09-22 ENCOUNTER — Encounter (HOSPITAL_COMMUNITY): Payer: Self-pay

## 2019-09-22 DIAGNOSIS — N390 Urinary tract infection, site not specified: Secondary | ICD-10-CM | POA: Diagnosis present

## 2019-09-22 LAB — POCT URINALYSIS DIP (DEVICE)
Bilirubin Urine: NEGATIVE
Glucose, UA: NEGATIVE mg/dL
Nitrite: POSITIVE — AB
Protein, ur: NEGATIVE mg/dL
Specific Gravity, Urine: >=1.03 (ref 1.005–1.030)
Urobilinogen, UA: 2 mg/dL — ABNORMAL HIGH (ref 0.0–1.0)
pH: 5.5 (ref 5.0–8.0)

## 2019-09-22 MED ORDER — CEPHALEXIN 500 MG PO CAPS: 500.0000 mg | ORAL_CAPSULE | Freq: Three times a day (TID) | ORAL | 0 refills | Status: DC

## 2019-09-22 NOTE — Discharge Instructions (Addendum)
Take the antibiotic as directed.    A urine culture is pending and we will call you if your antibiotic needs to be changed.    Follow-up with your primary care provider or return here if your symptoms are not improving or if they get worse.

## 2019-09-22 NOTE — ED Provider Notes (Signed)
[facility]    CSN: [id number] Arrival date & time: 09/22/19  1729      History   Chief Complaint Chief Complaint  Patient presents with  . Dysuria  . Urinary Frequency    HPI Renee Lester is a 23 y.o. female.  Patient presents with 3-day history of dysuria and frequency. She denies fever, chills, abdominal pain, vaginal discharge, back pain, pelvic pain, or other symptoms. No treatments attempted at home.  LMP: 08/25/2019  The history is provided by the patient.    History reviewed. No pertinent past medical history.  Patient Active Problem List   Diagnosis Date Noted  . Increased urinary frequency 12/02/2018  . Class 3 severe obesity due to excess calories without serious comorbidity with body mass index (BMI) of 40.0 to 44.9 in adult (HCC) 12/02/2018  . Polycystic ovary syndrome 11/26/2018    History reviewed. No pertinent surgical history.  OB History    Gravida  1   Para      Term      Preterm      AB      Living        SAB      TAB      Ectopic      Multiple  0   Live Births               Home Medications    Prior to Admission medications   Medication Sig Start Date End Date Taking? Authorizing Provider  cephALEXin (KEFLEX) 500 MG capsule Take 1 capsule (500 mg total) by mouth 3 (three) times daily. 09/22/19   Mickie Bail, NP  medroxyPROGESTERone Acetate (DEPO-PROVERA) 150 MG/ML SUSY Depo-Provera 150 mg/mL intramuscular syringe  Inject 1 mL every 3 months by intramuscular route.    [provider]  metroNIDAZOLE (FLAGYL) 500 MG tablet metronidazole 500 mg tablet  Take 1 tablet twice a day by oral route for 7 days.    [provider]    Family History History reviewed. No pertinent family history.  Social History Social History   Tobacco Use  . Smoking status: Never Smoker  . Smokeless tobacco: Never Used  Substance Use Topics  . Alcohol use: No  . Drug use: No     Allergies   Patient has  no known allergies.   Review of Systems Review of Systems  Constitutional: Negative for chills and fever.  HENT: Negative for ear pain and sore throat.   Eyes: Negative for pain and visual disturbance.  Respiratory: Negative for cough and shortness of breath.   Cardiovascular: Negative for chest pain and palpitations.  Gastrointestinal: Negative for abdominal pain and vomiting.  Genitourinary: Positive for dysuria and frequency. Negative for flank pain, hematuria, pelvic pain and vaginal discharge.  Musculoskeletal: Negative for arthralgias and back pain.  Skin: Negative for color change and rash.  Neurological: Negative for seizures and syncope.  All other systems reviewed and are negative.    Physical Exam Triage Vital Signs ED Triage Vitals  Enc Vitals Group     BP 09/22/19 1754 134/85     Pulse Rate 09/22/19 1754 98     Resp 09/22/19 1754 17     Temp 09/22/19 1754 98 F (36.7 C)     Temp Source 09/22/19 1754 Oral     SpO2 09/22/19 1754 100 %     Weight --      Height --      Head Circumference --  Peak Flow --      Pain Score 09/22/19 1750 3     Pain Loc --      Pain Edu? --      Excl. in GC? --    No data found.  Updated Vital Signs BP 134/85 (BP Location: Right Arm)   Pulse 98   Temp 98 F (36.7 C) (Oral)   Resp 17   LMP 08/25/2019   SpO2 100%   Visual Acuity Right Eye Distance:   Left Eye Distance:   Bilateral Distance:    Right Eye Near:   Left Eye Near:    Bilateral Near:     Physical Exam Vitals signs and nursing note reviewed.  Constitutional:      General: She is not in acute distress.    Appearance: She is well-developed.  HENT:     Head: Normocephalic and atraumatic.     Mouth/Throat:     Mouth: Mucous membranes are moist.     Pharynx: Oropharynx is clear.  Eyes:     Conjunctiva/sclera: Conjunctivae normal.  Neck:     Musculoskeletal: Neck supple.  Cardiovascular:     Rate and Rhythm: Normal rate and regular rhythm.      Heart sounds: No murmur.  Pulmonary:     Effort: Pulmonary effort is normal. No respiratory distress.     Breath sounds: Normal breath sounds.  Abdominal:     General: Bowel sounds are normal.     Palpations: Abdomen is soft.     Tenderness: There is no abdominal tenderness. There is no right CVA tenderness, left CVA tenderness, guarding or rebound.  Skin:    General: Skin is warm and dry.  Neurological:     Mental Status: She is alert.      UC Treatments / Results  Labs (all labs ordered are listed, but only abnormal results are displayed) Labs Reviewed  POCT URINALYSIS DIP (DEVICE) - Abnormal; Notable for the following components:      Result Value   Ketones, ur TRACE (*)    Hgb urine dipstick MODERATE (*)    Urobilinogen, UA 2.0 (*)    Nitrite POSITIVE (*)    Leukocytes,Ua SMALL (*)    All other components within normal limits  URINE CULTURE    EKG   Radiology No results found.  Procedures Procedures (including critical care time)  Medications Ordered in UC Medications - No data to display  Initial Impression / Assessment and Plan / UC Course  I have reviewed the triage vital signs and the nursing notes.  Pertinent labs & imaging results that were available during my care of the patient were reviewed by me and considered in my medical decision making (see chart for details).    UTI. Urine culture pending. Discussed with patient that we will call her if her antibiotic needs to be changed based on the urine culture results. Discussed that she should follow-up with her PCP or return here for symptoms or not improving or get worse. Patient agrees to plan of care.     Final Clinical Impressions(s) / UC Diagnoses   Final diagnoses:  Urinary tract infection without hematuria, site unspecified     Discharge Instructions     Take the antibiotic as directed.    A urine culture is pending and we will call you if your antibiotic needs to be changed.     Follow-up with your primary care provider or return here if your symptoms are not improving or if  they get worse.        ED Prescriptions    Medication Sig Dispense Auth. Provider   cephALEXin (KEFLEX) 500 MG capsule Take 1 capsule (500 mg total) by mouth 3 (three) times daily. 28 capsule Sharion Balloon, NP     PDMP not reviewed this encounter.   Sharion Balloon, NP 09/22/19 [phone]

## 2019-09-22 NOTE — ED Triage Notes (Signed)
Pt states having burning sensation when urinating x 3 days. Pt reports an increase in the time she needs to go to urinate.

## 2019-09-24 LAB — URINE CULTURE: Culture: 30000 — AB

## 2019-10-21 ENCOUNTER — Other Ambulatory Visit (HOSPITAL_COMMUNITY)
Admission: RE | Admit: 2019-10-21 | Discharge: 2019-10-21 | Disposition: A | Discharge status: Home / Self Care | Payer: BC Managed Care – PPO | Source: Ambulatory Visit | Attending: Family Medicine | Admitting: Family Medicine

## 2019-10-21 ENCOUNTER — Encounter: Payer: Self-pay | Admitting: Family Medicine

## 2019-10-21 ENCOUNTER — Ambulatory Visit (INDEPENDENT_AMBULATORY_CARE_PROVIDER_SITE_OTHER): Payer: BC Managed Care – PPO | Admitting: Family Medicine

## 2019-10-21 ENCOUNTER — Other Ambulatory Visit: Payer: Self-pay

## 2019-10-21 VITALS — BP 102/64 | HR 113 | Ht 68.0 in | Wt 272.1 lb

## 2019-10-21 DIAGNOSIS — N898 Other specified noninflammatory disorders of vagina: Secondary | ICD-10-CM | POA: Insufficient documentation

## 2019-10-21 DIAGNOSIS — Z124 Encounter for screening for malignant neoplasm of cervix: Secondary | ICD-10-CM

## 2019-10-21 DIAGNOSIS — Z113 Encounter for screening for infections with a predominantly sexual mode of transmission: Secondary | ICD-10-CM | POA: Insufficient documentation

## 2019-10-21 HISTORY — DX: Other specified noninflammatory disorders of vagina: N89.8

## 2019-10-21 LAB — POCT WET PREP (WET MOUNT)
Clue Cells Wet Prep Whiff POC: POSITIVE
Trichomonas Wet Prep HPF POC: ABSENT

## 2019-10-21 MED ORDER — METRONIDAZOLE 500 MG PO TABS: 500.0000 mg | ORAL_TABLET | Freq: Two times a day (BID) | ORAL | 0 refills | Status: AC

## 2019-10-21 NOTE — Assessment & Plan Note (Addendum)
Wet prep consistent with BV, will treat with Flagyl x7 days. Also obtained GC/CT, HIV, and RPR per patient request. Safe sex practices reviewed.

## 2019-10-21 NOTE — Patient Instructions (Addendum)
It was great to see you!  Our plans for today:  - You have bacterial vaginosis. I sent in the prescription to the pharmacy.  - See below for ways to help prevent yeast and BV infections. - Make sure you wear a condom every time you have sex to prevent sexually transmitted infections. - We update your Pap smear today, we will call you with these results as well as the results of your HIV and syphilis testing.  Take care and seek immediate care sooner if you develop any concerns.   Dr. [facility]  GO WHITE: Soap: UNSCENTED Dove (white box light green writing) Laundry detergent (underwear)- Dreft or [employer] unscented WHITE 100% cotton panties (NOT just cotton crouch) Sanitary napkin/panty liners: UNSCENTED.  If it doesn't SAY unscented it can have a scent/perfume    NO PERFUMES OR LOTIONS OR POTIONS in the vulvar area (may use regular [place]) Condoms: hypoallergenic only. Non dyed (no color) Toilet papers: white only Wash clothes: use a separate wash cloth. WHITE.  Washed in Dreft.

## 2019-10-21 NOTE — Progress Notes (Signed)
  Subjective:   Patient ID: Renee Lester    DOB: 24-Oct-1996, 23 y.o. female   MRN: [id number]  Renee Lester is a 23 y.o. female with a history of PCOS, obesity here for   STD testing - G1P0 - Menses: irregular, follows with [facility] for PCOS w/u and treatment.  - Contraception: none - Cancer screening: due for pap - recently noticing abnormal vaginal odor when after she showers, does not seem consistent with prior BV infections. - Denies abnormal vaginal discharge or intermenstrual bleeding. Notices occasional itching after shaving.  - recently treated for UTI earlier this month.  - LMP 10/07/2019. Does have unprotected sex. Wants to be checked for STIs, no known STI contact.   Review of Systems:  Per HPI.  Medications and smoking status reviewed.  Objective:   BP 102/64   Pulse (!) 113   Ht 5\' 8"  (1.727 m)   Wt 272 lb 2 oz (123.4 kg)   LMP 10/07/2019   SpO2 99%   BMI 41.38 kg/m  Vitals and nursing note reviewed.  General: obese female, in no acute distress with non-toxic appearance GYN:  External genitalia within normal limits.  Vaginal mucosa pink, moist, normal rugae.  Nonfriable cervix with nabothian cyst at anterior portion of cervix. Minimal amount of thin white discharge noted on speculum exam.  No bleeding.  Bimanual exam revealed normal, nongravid uterus.  No cervical motion tenderness. No adnexal masses bilaterally.   Skin: warm, dry, no rashes or lesions Extremities: warm and well perfused, normal tone Neuro: Alert and oriented, speech normal  Assessment & Plan:   Vaginal odor Wet prep consistent with BV, will treat with Flagyl x7 days. Also obtained GC/CT, HIV, and RPR per patient request. Safe sex practices reviewed.   Health Maintenance Updated pap smear. Not interested in birth control at this time. Safe sex practices reviewed.   Orders Placed This Encounter  Procedures  . HIV antibody (with reflex)  . RPR  . POCT Wet Prep [facility])    Meds ordered this encounter  Medications  . metroNIDAZOLE (FLAGYL) 500 MG tablet    Sig: Take 1 tablet (500 mg total) by mouth 2 (two) times daily for 7 days.    Dispense:  14 tablet    Refill:  0    Rory Percy, DO PGY-3, [facility] 10/22/2019 10:00 AM

## 2019-10-22 LAB — RPR: RPR Ser Ql: NONREACTIVE

## 2019-10-22 LAB — HIV ANTIBODY (ROUTINE TESTING W REFLEX): HIV Screen 4th Generation wRfx: NONREACTIVE

## 2019-10-24 ENCOUNTER — Telehealth: Payer: Self-pay | Admitting: Family Medicine

## 2019-10-24 LAB — CYTOLOGY - PAP
Chlamydia: NEGATIVE
Comment: NEGATIVE
Comment: NEGATIVE
Comment: NORMAL
Diagnosis: NEGATIVE
Neisseria Gonorrhea: NEGATIVE
Trichomonas: NEGATIVE

## 2019-10-24 NOTE — Telephone Encounter (Signed)
Pt is returning a missed call from our office. She thinks the call was concerning her results from her last appointment. 12/01.  Pt would like for someone to call her to discuss results. The best call back number is [phone].

## 2019-10-24 NOTE — Telephone Encounter (Signed)
Will forward to Dr. Ky Barban to see if she tried reaching out to patient. Jazmin Hartsell,CMA

## 2019-10-25 NOTE — Telephone Encounter (Signed)
I did not call her. I released her results to her MyChart this morning.

## 2020-02-19 ENCOUNTER — Ambulatory Visit: Payer: BC Managed Care – PPO | Admitting: Family Medicine

## 2020-03-05 ENCOUNTER — Other Ambulatory Visit: Payer: Self-pay

## 2020-03-05 ENCOUNTER — Ambulatory Visit (INDEPENDENT_AMBULATORY_CARE_PROVIDER_SITE_OTHER): Payer: BC Managed Care – PPO | Admitting: Family Medicine

## 2020-03-05 ENCOUNTER — Other Ambulatory Visit (HOSPITAL_COMMUNITY)
Admission: RE | Admit: 2020-03-05 | Discharge: 2020-03-05 | Disposition: A | Discharge status: Home / Self Care | Payer: BC Managed Care – PPO | Source: Ambulatory Visit | Attending: Family Medicine | Admitting: Family Medicine

## 2020-03-05 ENCOUNTER — Encounter: Payer: Self-pay | Admitting: Family Medicine

## 2020-03-05 VITALS — BP 116/70 | HR 81 | Wt 270.0 lb

## 2020-03-05 DIAGNOSIS — Z113 Encounter for screening for infections with a predominantly sexual mode of transmission: Secondary | ICD-10-CM | POA: Insufficient documentation

## 2020-03-05 DIAGNOSIS — Z7251 High risk heterosexual behavior: Secondary | ICD-10-CM | POA: Diagnosis not present

## 2020-03-05 DIAGNOSIS — N760 Acute vaginitis: Secondary | ICD-10-CM | POA: Diagnosis not present

## 2020-03-05 DIAGNOSIS — Z114 Encounter for screening for human immunodeficiency virus [HIV]: Secondary | ICD-10-CM

## 2020-03-05 DIAGNOSIS — B9689 Other specified bacterial agents as the cause of diseases classified elsewhere: Secondary | ICD-10-CM

## 2020-03-05 LAB — POCT WET PREP (WET MOUNT)
Clue Cells Wet Prep Whiff POC: POSITIVE
Trichomonas Wet Prep HPF POC: ABSENT

## 2020-03-05 MED ORDER — METRONIDAZOLE 500 MG PO TABS: 500.0000 mg | ORAL_TABLET | Freq: Three times a day (TID) | ORAL | 0 refills | Status: DC

## 2020-03-05 MED ORDER — METRONIDAZOLE 500 MG PO TABS: 500.0000 mg | ORAL_TABLET | Freq: Two times a day (BID) | ORAL | 0 refills | Status: DC

## 2020-03-05 NOTE — Progress Notes (Signed)
    SUBJECTIVE:   CHIEF COMPLAINT / HPI:   STI testing - G1P0 - Menses: irregular, follows with [facility] for PCOS. Takes metformin to help with ovulation.  - Contraception: none  - Cancer screening: PAP done 10/21/19 negative  - recently noticing abnormal vaginal odor (fishy) - Reports thin white vaginal discharge. Denies vaginal bleeding and itching.   - Patient's last menstrual period was 02/13/2020 (exact date). . Does have unprotected sex. Last unprotected sexual encounter was about a month with her same partner.  Wants to be checked for STIs, no known STI exposure. Has hx of chlamydia (treated).     PERTINENT  PMH / PSH: hx of BV, PCOS  OBJECTIVE:   BP 116/70   Pulse 81   Wt 270 lb (122.5 kg)   LMP 02/13/2020 (Exact Date)   SpO2 98%   Breastfeeding No   BMI 41.05 kg/m    GEN: well appearing female in no acute distress, afebrile  CV: regular rate RESP: no increased work of breathing ABD: Bowel sounds present. Soft, Nontender, Nondistended. No palpable masses  SKIN: warm, dry Pelvic exam: VULVA: normal appearing vulva with no masses, tenderness or lesions, VAGINA: vaginal discharge - white and thin, WET MOUNT done - results: KOH done, clue cells, white blood cells, DNA probe for chlamydia and GC obtained, CERVIX: lesions absent, cervical discharge present - white and thick, cervical motion tenderness absent, UTERUS: uterus is normal size, shape, consistency and nontender, ADNEXA: normal adnexa in size, nontender and no masses, exam chaperoned by CMA.   ASSESSMENT/PLAN:   BV (bacterial vaginosis)  Treatment: Flagyl 500 BID x 7 days and abstain from coitus during course of treatment. Advised patient to not drink alcohol while taking this medication. Return if symptoms persist or worsen.      High risk sexual behavior GC and chlamydia DNA  probe sent to lab. HIV and RPR collected. Advised patient to use barrier protection/condoms.  Patient undecided regarding  future contraception.         Katha Cabal, DO [facility]

## 2020-03-05 NOTE — Patient Instructions (Signed)

## 2020-03-06 DIAGNOSIS — B9689 Other specified bacterial agents as the cause of diseases classified elsewhere: Secondary | ICD-10-CM | POA: Insufficient documentation

## 2020-03-06 DIAGNOSIS — Z7251 High risk heterosexual behavior: Secondary | ICD-10-CM | POA: Insufficient documentation

## 2020-03-06 HISTORY — DX: Other specified bacterial agents as the cause of diseases classified elsewhere: B96.89

## 2020-03-06 NOTE — Assessment & Plan Note (Signed)
GC and chlamydia DNA  probe sent to lab. HIV and RPR collected. Advised patient to use barrier protection/condoms.  Patient undecided regarding future contraception.

## 2020-03-06 NOTE — Assessment & Plan Note (Signed)
Treatment: Flagyl 500 BID x 7 days and abstain from coitus during course of treatment. Advised patient to not drink alcohol while taking this medication. Return if symptoms persist or worsen.

## 2020-03-08 LAB — URINE CYTOLOGY ANCILLARY ONLY
Chlamydia: NEGATIVE
Comment: NEGATIVE
Comment: NORMAL
Neisseria Gonorrhea: NEGATIVE

## 2020-06-04 ENCOUNTER — Other Ambulatory Visit: Payer: Self-pay

## 2020-06-04 ENCOUNTER — Ambulatory Visit (INDEPENDENT_AMBULATORY_CARE_PROVIDER_SITE_OTHER): Payer: BC Managed Care – PPO | Admitting: Family Medicine

## 2020-06-04 VITALS — BP 108/60 | HR 72 | Ht 68.0 in | Wt 268.0 lb

## 2020-06-04 DIAGNOSIS — R350 Frequency of micturition: Secondary | ICD-10-CM

## 2020-06-04 DIAGNOSIS — R300 Dysuria: Secondary | ICD-10-CM

## 2020-06-04 LAB — POCT URINALYSIS DIP (MANUAL ENTRY)
Bilirubin, UA: NEGATIVE
Blood, UA: NEGATIVE
Glucose, UA: NEGATIVE mg/dL
Leukocytes, UA: NEGATIVE
Nitrite, UA: POSITIVE — AB
Spec Grav, UA: 1.025 (ref 1.010–1.025)
Urobilinogen, UA: 4 E.U./dL — AB
pH, UA: 7 (ref 5.0–8.0)

## 2020-06-04 LAB — POCT UA - MICROSCOPIC ONLY

## 2020-06-04 LAB — POCT URINE PREGNANCY: Preg Test, Ur: NEGATIVE

## 2020-06-04 MED ORDER — CEPHALEXIN 500 MG PO CAPS: 500.0000 mg | ORAL_CAPSULE | Freq: Two times a day (BID) | ORAL | 0 refills | Status: DC

## 2020-06-04 NOTE — Progress Notes (Signed)
    SUBJECTIVE:   CHIEF COMPLAINT / HPI:  UTI symptoms Patient reports burning with urination, increased urinary frequency for the past week and a half.  She reports she has had UTIs in the past and this feels like a UTI.  Denies any fever, chills, abdominal pain, flank pain.  Reports that she is sexually active with one partner and uses protection intermittently.  Patient reports that she gets STD checked every 3 months and it is time to get another check.  Would like STD testing including blood testing in the next few weeks.   OBJECTIVE:   BP 108/60   Pulse 72   Ht 5\' 8"  (1.727 m)   Wt 268 lb (121.6 kg)   LMP 05/11/2020   SpO2 98%   BMI 40.75 kg/m   General: Well-appearing, no acute distress, sitting in chair next to exam table Respiratory: Normal work of breathing, lungs clear to auscultation bilaterally Cardiac: Regular rate and rhythm, no murmurs appreciated Abdomen: Soft, nontender, positive bowel sounds   ASSESSMENT/PLAN:   Increased urinary frequency Patient reports symptoms of dysuria, increased urination.  Has history of UTIs in the past and feels that this may be the same as those previous UTIs. UA is significant for trace bilirubin, positive nitrites, trace proteins. -Keflex 500 mg twice daily for 5 days -Patient scheduled for follow-up visit for STI testing     05/13/2020, MD  [facility]

## 2020-06-04 NOTE — Patient Instructions (Signed)
It was wonderful to meet you today.  You were positive for a urinary tract infection today so we are going to treat that with Keflex 500 mg twice daily for 5 days.  I have scheduled you for a an appointment next week to have the test you requested on.  He have any questions or concerns in between please feel free to reach out to our clinic.  Happy have a wonderful afternoon!

## 2020-06-07 NOTE — Assessment & Plan Note (Signed)
Patient reports symptoms of dysuria, increased urination.  Has history of UTIs in the past and feels that this may be the same as those previous UTIs. UA is significant for trace bilirubin, positive nitrites, trace proteins. -Keflex 500 mg twice daily for 5 days -Patient scheduled for follow-up visit for STI testing

## 2020-06-10 ENCOUNTER — Encounter: Payer: Self-pay | Admitting: Family Medicine

## 2020-06-10 ENCOUNTER — Other Ambulatory Visit: Payer: Self-pay

## 2020-06-10 ENCOUNTER — Other Ambulatory Visit (HOSPITAL_COMMUNITY)
Admission: RE | Admit: 2020-06-10 | Discharge: 2020-06-10 | Disposition: A | Discharge status: Home / Self Care | Payer: BC Managed Care – PPO | Source: Ambulatory Visit | Attending: Family Medicine | Admitting: Family Medicine

## 2020-06-10 ENCOUNTER — Ambulatory Visit (INDEPENDENT_AMBULATORY_CARE_PROVIDER_SITE_OTHER): Payer: BC Managed Care – PPO | Admitting: Family Medicine

## 2020-06-10 VITALS — BP 104/66 | HR 79 | Ht 68.0 in | Wt 271.2 lb

## 2020-06-10 DIAGNOSIS — Z113 Encounter for screening for infections with a predominantly sexual mode of transmission: Secondary | ICD-10-CM

## 2020-06-10 DIAGNOSIS — Z7251 High risk heterosexual behavior: Secondary | ICD-10-CM | POA: Insufficient documentation

## 2020-06-10 DIAGNOSIS — B9689 Other specified bacterial agents as the cause of diseases classified elsewhere: Secondary | ICD-10-CM

## 2020-06-10 DIAGNOSIS — N760 Acute vaginitis: Secondary | ICD-10-CM

## 2020-06-10 DIAGNOSIS — Z1159 Encounter for screening for other viral diseases: Secondary | ICD-10-CM | POA: Diagnosis not present

## 2020-06-10 LAB — POCT WET PREP (WET MOUNT)
Clue Cells Wet Prep Whiff POC: POSITIVE
Trichomonas Wet Prep HPF POC: ABSENT

## 2020-06-10 MED ORDER — METRONIDAZOLE 500 MG PO TABS: 500.0000 mg | ORAL_TABLET | Freq: Two times a day (BID) | ORAL | 0 refills | Status: AC

## 2020-06-10 NOTE — Patient Instructions (Addendum)
It was great seeing you today! Please check-out at the front desk before leaving the clinic.   Visit Remembers: - Stop by the pharmacy to pick up your prescriptions  - Continue to work on your healthy eating habits and incorporating exercise into your daily life. - Medicine Changes: start Metronidazole, do not drink Alcoholic beverages while taking this medication    Regarding lab work today:  Due to recent changes in healthcare laws, you may see the results of your imaging and laboratory studies on MyChart before your provider has had a chance to review them.  I understand that in some cases there may be results that are confusing or concerning to you. Not all laboratory results come back in the same time frame and you may be waiting for multiple results in order to interpret others.  Please give [place] 72 hours in order for your provider to thoroughly review all the results before contacting the office for clarification of your results. If everything is normal, you will get a letter in the mail or a message in My Chart. Please give [place] a call if you do not hear from [place] after 2 weeks.  Please bring all of your medications with you to each visit.    If you haven't already, sign up for My Chart to have easy access to your labs results, and communication with your primary care physician.  Feel free to call with any questions or concerns at any time, at [phone].   Take care,  Dr. [facility]  Bacterial Vaginosis  Bacterial vaginosis is a vaginal infection that occurs when the normal balance of bacteria in the vagina is disrupted. It results from an overgrowth of certain bacteria. This is the most common vaginal infection among women ages 23-44. Because bacterial vaginosis increases your risk for STIs (sexually transmitted infections), getting treated can help reduce your risk for chlamydia, gonorrhea, herpes, and HIV (human immunodeficiency virus). Treatment is  also important for preventing complications in pregnant women, because this condition can cause an early (premature) delivery. What are the causes? This condition is caused by an increase in harmful bacteria that are normally present in small amounts in the vagina. However, the reason that the condition develops is not fully understood. What increases the risk? The following factors may make you more likely to develop this condition:  Having a new sexual partner or multiple sexual partners.  Having unprotected sex.  Douching.  Having an intrauterine device (IUD).  Smoking.  Drug and alcohol abuse.  Taking certain antibiotic medicines.  Being pregnant. You cannot get bacterial vaginosis from toilet seats, bedding, swimming pools, or contact with objects around you. What are the signs or symptoms? Symptoms of this condition include:  Grey or white vaginal discharge. The discharge can also be watery or foamy.  A fish-like odor with discharge, especially after sexual intercourse or during menstruation.  Itching in and around the vagina.  Burning or pain with urination. Some women with bacterial vaginosis have no signs or symptoms. How is this diagnosed? This condition is diagnosed based on:  Your medical history.  A physical exam of the vagina.  Testing a sample of vaginal fluid under a microscope to look for a large amount of bad bacteria or abnormal cells. Your health care provider may use a cotton swab or a small wooden spatula to collect the sample. How is this treated? This condition is treated with antibiotics. These may be given as a pill, a  vaginal cream, or a medicine that is put into the vagina (suppository). If the condition comes back after treatment, a second round of antibiotics may be needed. Follow these instructions at home: Medicines  Take over-the-counter and prescription medicines only as told by your health care provider.  Take or use your antibiotic  as told by your health care provider. Do not stop taking or using the antibiotic even if you start to feel better. General instructions  If you have a female sexual partner, tell her that you have a vaginal infection. She should see her health care provider and be treated if she has symptoms. If you have a female sexual partner, he does not need treatment.  During treatment: ? Avoid sexual activity until you finish treatment. ? Do not douche. ? Avoid alcohol as directed by your health care provider. ? Avoid breastfeeding as directed by your health care provider.  Drink enough water and fluids to keep your urine clear or pale yellow.  Keep the area around your vagina and rectum clean. ? Wash the area daily with warm water. ? Wipe yourself from front to back after using the toilet.  Keep all follow-up visits as told by your health care provider. This is important. How is this prevented?  Do not douche.  Wash the outside of your vagina with warm water only.  Use protection when having sex. This includes latex condoms and dental dams.  Limit how many sexual partners you have. To help prevent bacterial vaginosis, it is best to have sex with just one partner (monogamous).  Make sure you and your sexual partner are tested for STIs.  Wear cotton or cotton-lined underwear.  Avoid wearing tight pants and pantyhose, especially during summer.  Limit the amount of alcohol that you drink.  Do not use any products that contain nicotine or tobacco, such as cigarettes and e-cigarettes. If you need help quitting, ask your health care provider.  Do not use illegal drugs. Where to find more information  Centers for Disease Control and Prevention: SolutionApps.co.za  American Sexual Health Association (ASHA): www.ashastd.org  U.S. Department of Health and Health and safety inspector, Office on Women's Health: ConventionalMedicines.si or http://www.anderson-williamson.info/ Contact a health  care provider if:  Your symptoms do not improve, even after treatment.  You have more discharge or pain when urinating.  You have a fever.  You have pain in your abdomen.  You have pain during sex.  You have vaginal bleeding between periods. Summary  Bacterial vaginosis is a vaginal infection that occurs when the normal balance of bacteria in the vagina is disrupted.  Because bacterial vaginosis increases your risk for STIs (sexually transmitted infections), getting treated can help reduce your risk for chlamydia, gonorrhea, herpes, and HIV (human immunodeficiency virus). Treatment is also important for preventing complications in pregnant women, because the condition can cause an early (premature) delivery.  This condition is treated with antibiotic medicines. These may be given as a pill, a vaginal cream, or a medicine that is put into the vagina (suppository). This information is not intended to replace advice given to you by your health care provider. Make sure you discuss any questions you have with your health care provider. Document Revised: 10/19/2017 Document Reviewed: 07/22/2016 Elsevier Patient Education  2020 [employer].

## 2020-06-10 NOTE — Progress Notes (Addendum)
    SUBJECTIVE:   CHIEF COMPLAINT / HPI:   Chief Complaint  Patient presents with  . STD check   Renee Lester is a 24 y.o. female here for STD check.   STI check - recently became sexually active with a same female partner she has had since high school, wants to be checked for STIs. Previously negative RPR, HIV in Dec 2020. No symptoms in herself or in partner. - Symptoms:  - Recent antibiotics : Keflex started for UTI dx last week  - Sexually active with one female partner - Last sexual encounter: a week ago  - Contraception: none  - Hx of chlamydia   Symptoms - Abnormal vaginal discharge: no - Missed period: no, Patient's last menstrual period was 05/11/2020. - Fever: no - Abdominal/Pelvic pain: no - Vaginal bleeding: no - Pain during sex: no - Rash: no      PERTINENT  PMH / PSH: hx of chlamydia, PCOS   OBJECTIVE:   BP 104/66   Pulse 79   Ht 5\' 8"  (1.727 m)   Wt (!) 271 lb 3.2 oz (123 kg)   LMP 05/11/2020   SpO2 100%   BMI 41.24 kg/m   GEN: well appearing female in no acute distress  CVS: well perfused  RESP: speaking in full sentences without pause  ABD: soft, non-tender, non-distended, no palpable masses  Pelvic exam: normal external genitalia, vulva, VAGINA and CERVIX: cervical discharge present - white and curd-like, WET MOUNT done - results: KOH done, clue cells, excessive bacteria, positive Whiff test, DNA probe for chlamydia and GC obtained, cervical motion tenderness absent, ADNEXA: normal adnexa in size, nontender and no masses, exam chaperoned by CMA.    ASSESSMENT/PLAN:   BV (bacterial vaginosis)   Confirmed on wet prep. G/C 05/13/2020 is pending. Symptoms consistent with this.  - Treatment: Flagyl 500 BID x 7 days and abstain from coitus during course of treatment. Advised patient to not drink alcohol while taking this medication.  - F/U if symptoms not improving or getting worse.  - Will f/u on G/C Chlamydia and call in Rx if positive.  - Self  care instructions given including avoiding douching. Handout given.  - F/U with PCP as needed.  - Return precautions including abdominal pain, fever, chills, nausea, or vomiting given.      High risk sexual behavior GC and chlamydia DNA  probe sent to lab. HIV and RPR collected. She believes it will be difficult for her to conceive given she has PCOS.  She states she has had the same partner since high school and gets STI checks every 3-4 months. Advised patient to use barrier protection/condoms. Patient anticipates wanting to conceive in the next year and does not wish to use contraception at this time.  Prenatal vitamins sent to pharmacy.    Percell Locus, DO [facility]

## 2020-06-10 NOTE — Assessment & Plan Note (Addendum)
°   Confirmed on wet prep. G/C Percell Locus is pending. Symptoms consistent with this.  - Treatment: Flagyl 500 BID x 7 days and abstain from coitus during course of treatment. Advised patient to not drink alcohol while taking this medication.  - F/U if symptoms not improving or getting worse.  - Will f/u on G/C Chlamydia and call in Rx if positive.  - Self care instructions given including avoiding douching. Handout given.  - F/U with PCP as needed.  - Return precautions including abdominal pain, fever, chills, nausea, or vomiting given.

## 2020-06-11 ENCOUNTER — Ambulatory Visit: Payer: BC Managed Care – PPO | Admitting: Family Medicine

## 2020-06-11 LAB — RPR: RPR Ser Ql: NONREACTIVE

## 2020-06-11 LAB — CERVICOVAGINAL ANCILLARY ONLY
Chlamydia: NEGATIVE
Comment: NEGATIVE
Comment: NORMAL
Neisseria Gonorrhea: NEGATIVE

## 2020-06-11 LAB — HIV ANTIBODY (ROUTINE TESTING W REFLEX): HIV Screen 4th Generation wRfx: NONREACTIVE

## 2020-06-11 LAB — HEPATITIS C ANTIBODY: Hep C Virus Ab: <0.1 s/co ratio (ref 0.0–0.9)

## 2020-06-11 MED ORDER — PRENATAL VITAMINS 28-0.8 MG PO TABS: 1.0000 | ORAL_TABLET | Freq: Every day | ORAL | 11 refills | Status: DC

## 2020-06-11 NOTE — Addendum Note (Signed)
Addended by: Katha Cabal D on: 06/11/2020 12:29 AM   Modules accepted: Orders

## 2020-06-11 NOTE — Assessment & Plan Note (Addendum)
GC and chlamydia DNA  probe sent to lab. HIV and RPR collected. She believes it will be difficult for her to conceive given she has PCOS.  She states she has had the same partner since high school and gets STI checks every 3-4 months. Advised patient to use barrier protection/condoms. Patient anticipates wanting to conceive in the next year and does not wish to use contraception at this time.  Prenatal vitamins sent to pharmacy.

## 2020-08-02 ENCOUNTER — Encounter: Payer: Self-pay | Admitting: Family Medicine

## 2020-08-23 ENCOUNTER — Encounter: Payer: Self-pay | Admitting: Family Medicine

## 2020-08-23 ENCOUNTER — Other Ambulatory Visit: Payer: Self-pay

## 2020-08-23 ENCOUNTER — Other Ambulatory Visit (HOSPITAL_COMMUNITY)
Admission: RE | Admit: 2020-08-23 | Discharge: 2020-08-23 | Disposition: A | Discharge status: Home / Self Care | Payer: BC Managed Care – PPO | Source: Ambulatory Visit | Attending: Family Medicine | Admitting: Family Medicine

## 2020-08-23 ENCOUNTER — Ambulatory Visit (INDEPENDENT_AMBULATORY_CARE_PROVIDER_SITE_OTHER): Payer: BC Managed Care – PPO | Admitting: Family Medicine

## 2020-08-23 VITALS — BP 108/64 | HR 88 | Wt 275.0 lb

## 2020-08-23 DIAGNOSIS — Z113 Encounter for screening for infections with a predominantly sexual mode of transmission: Secondary | ICD-10-CM

## 2020-08-23 DIAGNOSIS — Z7251 High risk heterosexual behavior: Secondary | ICD-10-CM | POA: Diagnosis not present

## 2020-08-23 LAB — POCT WET PREP (WET MOUNT)
Clue Cells Wet Prep Whiff POC: POSITIVE
Trichomonas Wet Prep HPF POC: ABSENT

## 2020-08-23 NOTE — Assessment & Plan Note (Signed)
Patient was exposed to individual with chlamydia.  Denies any concerning symptoms such as pelvic pain, unusual vaginal bleeding, change in discharge. -Check for gonorrhea, chlamydia, bacterial vaginosis, yeast, and trichomonas, HIV and syphilis. - will call with results and treat as needed.

## 2020-08-23 NOTE — Progress Notes (Signed)
    SUBJECTIVE:   CHIEF COMPLAINT / HPI:   Exposure to STI: Pleasant patient presents to clinic reporting she recently had intercourse, received a call from her partner, she was told he had chlamydia.  She denies any vaginal bleeding, vaginal discharge, pelvic pain, dysuria.  Like to be checked for all STIs.  She is not on any form of contraception as she wishes to conceive within the next year or so.  Health maintenance: Patient due for flu and Covid vaccines  PERTINENT  PMH / PSH: PCOS, recently on Provera for missed period, has irregular periods  OBJECTIVE:   BP 108/64   Pulse 88   Wt 275 lb (124.7 kg)   LMP 08/02/2020   BMI 41.81 kg/m    Physical exam: General: Pleasant patient, nontoxic-appearing, no apparent distress Respiratory: Comfortable work of breathing, speaking complete sentences GU: Normal-appearing labia minora and majora without lesion, healthy appearing vaginal rugae with thin white discharge appreciated, cervix normal in appearance without bleeding or lesions   ASSESSMENT/PLAN:   High risk sexual behavior Patient was exposed to individual with chlamydia.  Denies any concerning symptoms such as pelvic pain, unusual vaginal bleeding, change in discharge. -Check for gonorrhea, chlamydia, bacterial vaginosis, yeast, and trichomonas, HIV and syphilis. - will call with results and treat as needed.     Dollene Cleveland, DO [facility]

## 2020-08-23 NOTE — Patient Instructions (Signed)
Thank you for coming in to see [place] today! Please see below to review our plan for today's visit:  1.  We are checking today for gonorrhea, chlamydia, bacterial vaginosis, yeast, and trichomonas.  We are also doing blood work to double check for HIV and syphilis. 2.  We will call you with results and treat as needed.   Please call the clinic at [phone] if your symptoms worsen or you have any concerns. It was our pleasure to serve you!   Dr. Peggyann Shoals [facility]

## 2020-08-24 LAB — RPR: RPR Ser Ql: NONREACTIVE

## 2020-08-24 LAB — HIV ANTIBODY (ROUTINE TESTING W REFLEX): HIV Screen 4th Generation wRfx: NONREACTIVE

## 2020-08-25 ENCOUNTER — Other Ambulatory Visit: Payer: Self-pay | Admitting: Family Medicine

## 2020-08-25 DIAGNOSIS — N760 Acute vaginitis: Secondary | ICD-10-CM

## 2020-08-25 DIAGNOSIS — Z7251 High risk heterosexual behavior: Secondary | ICD-10-CM

## 2020-08-25 LAB — CERVICOVAGINAL ANCILLARY ONLY
Chlamydia: NEGATIVE
Comment: NEGATIVE
Comment: NORMAL
Neisseria Gonorrhea: NEGATIVE

## 2020-08-25 MED ORDER — METRONIDAZOLE 500 MG PO TABS: 500.0000 mg | ORAL_TABLET | Freq: Two times a day (BID) | ORAL | 0 refills | Status: DC

## 2020-08-25 MED ORDER — LIDOCAINE HCL 1 % IJ SOLN
250.0000 mg | Freq: Once | INTRAMUSCULAR | Status: AC
  Administered 2020-08-26: 250 mg via INTRAMUSCULAR

## 2020-08-25 MED ORDER — AZITHROMYCIN 500 MG PO TABS
1000.0000 mg | ORAL_TABLET | Freq: Once | ORAL | Status: AC
  Administered 2020-08-26: 1000 mg via ORAL

## 2020-08-25 NOTE — Progress Notes (Signed)
Orders placed for nurse visit tomorrow 08/26/2020 for Azithromycin 1000mg  x1 and CTX IM 250mg  x2. Nurse visit scheduled for 1:30pm, patient notified. Additionally, Flagyl 500mg  twice daily x7 days sent to patient's pharmacy.   , DO [facility], PGY-3 08/25/2020 11:45 AM

## 2020-08-26 ENCOUNTER — Ambulatory Visit (INDEPENDENT_AMBULATORY_CARE_PROVIDER_SITE_OTHER): Payer: BC Managed Care – PPO

## 2020-08-26 ENCOUNTER — Other Ambulatory Visit: Payer: Self-pay

## 2020-08-26 DIAGNOSIS — Z7251 High risk heterosexual behavior: Secondary | ICD-10-CM

## 2020-08-26 DIAGNOSIS — Z202 Contact with and (suspected) exposure to infections with a predominantly sexual mode of transmission: Secondary | ICD-10-CM

## 2020-08-26 NOTE — Progress Notes (Signed)
Patient in nurse clinic today for STD treatment of Gonorrhea and Chlamydia.    Patient's test were negative, however precepted with Manson Passey. Since patient was exposed we will move forward with treatment.   Patient advised to abstain from sex for 7-10 days after treatment of self and partner.    Azithromycin 1 GM PO x 1 given and Ceftriaxone 250 mg IM x 2 given in LUOQ and RUOQ per Dr. Ewell Poe orders.  Patient observed 15 minutes in office.  No reaction noted.   Patient to follow up in 2-3 months for re-screening.    Provided condoms and advised to use with all sexual activity. Patient verbalized understanding.

## 2020-08-27 ENCOUNTER — Encounter: Payer: Self-pay | Admitting: Family Medicine

## 2020-10-07 ENCOUNTER — Other Ambulatory Visit (HOSPITAL_COMMUNITY)
Admission: RE | Admit: 2020-10-07 | Discharge: 2020-10-07 | Disposition: A | Discharge status: Home / Self Care | Payer: BC Managed Care – PPO | Source: Ambulatory Visit | Attending: Family Medicine | Admitting: Family Medicine

## 2020-10-07 ENCOUNTER — Ambulatory Visit (INDEPENDENT_AMBULATORY_CARE_PROVIDER_SITE_OTHER): Payer: BC Managed Care – PPO | Admitting: Family Medicine

## 2020-10-07 ENCOUNTER — Other Ambulatory Visit: Payer: Self-pay

## 2020-10-07 VITALS — BP 102/80 | HR 94 | Ht 68.0 in | Wt 278.0 lb

## 2020-10-07 DIAGNOSIS — Z113 Encounter for screening for infections with a predominantly sexual mode of transmission: Secondary | ICD-10-CM

## 2020-10-07 NOTE — Patient Instructions (Signed)
We will contact you with the results of your STI screen and treat as needed.   Please wear barrier protection to prevent the spread of STIs.   Follow up as needed.   Peggyann Shoals, DO [facility], PGY-3 10/07/2020 12:56 PM

## 2020-10-07 NOTE — Assessment & Plan Note (Signed)
Patient rescreened for gonorrhea chlamydia for test of cure after being treated with azithromycin and IM ceftriaxone in the office in October 2021 -We will follow-up with results, treat as necessary

## 2020-10-07 NOTE — Progress Notes (Signed)
    SUBJECTIVE:   CHIEF COMPLAINT / HPI:   Follow up STD Testing: Patient was seen in October 2021 after she had an exposure to STI.  She was treated in the office with both ceftriaxone and azithromycin.  She was instructed at that time to follow-up in 2-3 months for rescreening.  Patient denies any new concerns or symptoms, provided urine sample to have GC/chlamydia rechecked.   PERTINENT  PMH / PSH:  Patient Active Problem List   Diagnosis Date Noted  . Routine screening for STI (sexually transmitted infection) 10/07/2020  . High risk sexual behavior 03/06/2020  . Class 3 severe obesity due to excess calories without serious comorbidity with body mass index (BMI) of 40.0 to 44.9 in adult (HCC) 12/02/2018  . Polycystic ovary syndrome 11/26/2018    OBJECTIVE:   BP 102/80   Pulse 94   Ht 5\' 8"  (1.727 m)   Wt 278 lb (126.1 kg)   LMP 09/07/2020   SpO2 99%   BMI 42.27 kg/m    Physical exam: General: Well-appearing patient, no apparent distress Respiratory: Comfortable work of breathing   ASSESSMENT/PLAN:   Routine screening for STI (sexually transmitted infection) Patient rescreened for gonorrhea chlamydia for test of cure after being treated with azithromycin and IM ceftriaxone in the office in October 2021 -We will follow-up with results, treat as necessary     November 2021, DO [facility]

## 2020-10-08 LAB — URINE CYTOLOGY ANCILLARY ONLY
Chlamydia: NEGATIVE
Comment: NEGATIVE
Comment: NEGATIVE
Comment: NORMAL
Neisseria Gonorrhea: NEGATIVE
Trichomonas: NEGATIVE

## 2020-10-09 ENCOUNTER — Encounter: Payer: Self-pay | Admitting: Family Medicine

## 2020-10-28 ENCOUNTER — Other Ambulatory Visit: Payer: Self-pay | Admitting: Family Medicine

## 2020-10-28 DIAGNOSIS — B9689 Other specified bacterial agents as the cause of diseases classified elsewhere: Secondary | ICD-10-CM

## 2020-10-28 DIAGNOSIS — Z7251 High risk heterosexual behavior: Secondary | ICD-10-CM

## 2020-12-01 ENCOUNTER — Other Ambulatory Visit: Payer: BC Managed Care – PPO

## 2020-12-01 DIAGNOSIS — Z20822 Contact with and (suspected) exposure to covid-19: Secondary | ICD-10-CM

## 2020-12-04 LAB — NOVEL CORONAVIRUS, NAA: SARS-CoV-2, NAA: DETECTED — AB

## 2021-04-22 ENCOUNTER — Encounter: Payer: Self-pay | Admitting: Family Medicine

## 2021-05-02 NOTE — Progress Notes (Signed)
   Subjective:   Patient ID: Renee Lester    DOB: [birth year], 25 y.o. female   MRN: [id number]  Renee Lester is a 25 y.o. female with a history of PCOS, obesity, high risk sexual behavior here for fatigue  HPI: Feeling very tired all the time x months but worsening over last 1-2 months. Denies SOB. She feel just exhausted going to room to room. She notes sleeping well at night, get about 8 hours per night. Sleeps around 12am and wakes up at 9am. She falls asleep with the TV on but it is turned off after few hours. Has never been told she snores. Endorses looser stools. Denies skin changes, hair loss, difficulty swallowing, dark stools/blood in stools, hematuria. Has history of PCOS but has been having regular periods lately. Notes they have been very mild - uses about 5 pads a day but they are only half filled. LMP 04/22/21.  Contraception: no Denies depression, suicide.  PHQ-9:4 Denies caffeine use.  Denies any pertinent family history. Denies family history of PCOS.  Review of Systems:  Per HPI.   Objective:   BP 138/88   Pulse 99   Wt 295 lb 9.6 oz (134.1 kg)   LMP 04/22/2021   SpO2 100%   BMI 44.95 kg/m  Vitals and nursing note reviewed.  General: pleasant young obese woman, sitting comfortably in exam chair, well nourished, well developed, in no acute distress with non-toxic appearance HEENT: normocephalic, atraumatic Neck: supple, non-tender without lymphadenopathy, no thyromegaly CV: regular rate and rhythm without murmurs, rubs, or gallops, no lower extremity edema, 2+ radial and pedal pulses bilaterally Skin: warm, dry Extremities: warm and well perfused, normal tone MSK: gait normal Neuro: Alert and oriented, speech normal, no tremors  Assessment & Plan:   Fatigue Chronic but worsening per patient. Unclear etiology. Will obtain basic labs to evaluate for underlying cause. Suspect obesity and continued weight gain and deconditioning are contributing. Strongly  encouraged weight loss and exercise. I did appreciate a small assymetry to right medial lower portion of thyroid. Body habitus may be scewing exam findings. Will start with TSH and have her follow up with PCP in 1 month to reevaluate. Recommend repeat thyroid exam at that time. HIV negative 2021. Denies high risk.  No lymphadenopathy. No respiratory symptoms. Denies symptoms of OSA.  CBC, TSH, B12, and Vitamin D.  Encouraged to start Vitamin D supplement daily.  Follow up with PCP in 1 month  Orders Placed This Encounter  Procedures   CBC   TSH   Vitamin B12   Vitamin D, 25-hydroxy    No orders of the defined types were placed in this encounter.     Orpah Cobb, DO PGY-3, [facility] 05/05/2021 2:13 PM

## 2021-05-05 ENCOUNTER — Encounter: Payer: Self-pay | Admitting: Family Medicine

## 2021-05-05 ENCOUNTER — Other Ambulatory Visit: Payer: Self-pay

## 2021-05-05 ENCOUNTER — Ambulatory Visit (INDEPENDENT_AMBULATORY_CARE_PROVIDER_SITE_OTHER): Payer: BC Managed Care – PPO | Admitting: Family Medicine

## 2021-05-05 VITALS — BP 138/88 | HR 99 | Wt 295.6 lb

## 2021-05-05 DIAGNOSIS — R5383 Other fatigue: Secondary | ICD-10-CM | POA: Diagnosis not present

## 2021-05-05 NOTE — Assessment & Plan Note (Signed)
Chronic but worsening per patient. Unclear etiology. Will obtain basic labs to evaluate for underlying cause. Suspect obesity and continued weight gain and deconditioning are contributing. Strongly encouraged weight loss and exercise. I did appreciate a small assymetry to right medial lower portion of thyroid. Body habitus may be scewing exam findings. Will start with TSH and have her follow up with PCP in 1 month to reevaluate. Recommend repeat thyroid exam at that time. HIV negative 2021. Denies high risk.  No lymphadenopathy. No respiratory symptoms. Denies symptoms of OSA.  CBC, TSH, B12, and Vitamin D.  Encouraged to start Vitamin D supplement daily.  Follow up with PCP in 1 month

## 2021-05-05 NOTE — Patient Instructions (Signed)
I am obtaining labs to further evaluate your fatigue.  I will call you if there is anything abnormal and needs treatment.  He can also see your labs on MyChart. We should not underestimate weight gain and deconditioning as a cause for fatigue.  So I strongly encourage weight loss and improving exercise. I do recommend that you follow-up with your PCP Dr. Wynelle Link in 1 month for follow-up.

## 2021-05-06 ENCOUNTER — Other Ambulatory Visit: Payer: Self-pay | Admitting: Family Medicine

## 2021-05-06 DIAGNOSIS — E559 Vitamin D deficiency, unspecified: Secondary | ICD-10-CM

## 2021-05-06 LAB — VITAMIN D 25 HYDROXY (VIT D DEFICIENCY, FRACTURES): Vit D, 25-Hydroxy: 7 ng/mL — ABNORMAL LOW (ref 30.0–100.0)

## 2021-05-06 LAB — TSH: TSH: 2.23 u[IU]/mL (ref 0.450–4.500)

## 2021-05-06 LAB — CBC
Hematocrit: 37.3 % (ref 34.0–46.6)
Hemoglobin: 12.6 g/dL (ref 11.1–15.9)
MCH: 28.3 pg (ref 26.6–33.0)
MCHC: 33.8 g/dL (ref 31.5–35.7)
MCV: 84 fL (ref 79–97)
Platelets: 333 10*3/uL (ref 150–450)
RBC: 4.46 x10E6/uL (ref 3.77–5.28)
RDW: 12.1 % (ref 11.7–15.4)
WBC: 6.5 10*3/uL (ref 3.4–10.8)

## 2021-05-06 LAB — VITAMIN B12: Vitamin B-12: 325 pg/mL (ref 232–1245)

## 2021-05-06 MED ORDER — VITAMIN D (ERGOCALCIFEROL) 1.25 MG (50000 UNIT) PO CAPS: 50000.0000 [IU] | ORAL_CAPSULE | ORAL | 0 refills | Status: AC

## 2021-06-15 NOTE — Progress Notes (Signed)
    SUBJECTIVE:   CHIEF COMPLAINT / HPI:   Ear Popping in left ear for 2-3 weeks ago. Happened after she left the movies. Happens when she has hiccups and burps. Pops 1-2x a day. Was painful the first week whenever she burped or hiccupped, stabbing pain and ear felt full. Now the pain is dull and tolerable. Not taking anything for the pain. No changes in hearing, or balance or ringing sensation. Doesn't happen if she changes positions or moves head. Used cue-tips to clean ear and appreciated improvement in pain. Takes vitamin D and no other medications or supplements. Patient has no allergies or nasal drainage/congestion, and has not been swimming recently.   PERTINENT  PMH / PSH: PCOS  OBJECTIVE:   Vitals:   06/16/21 1453  BP: 117/65  Pulse: 100  SpO2: 100%   Physical Exam: Both ears were impacted with cerumen, occluding ear drum. Dark brown/red in color. Ear canal was pink and non-inflamed. No pain with manipulation of ear.    ASSESSMENT/PLAN:   Popping of left ear Patient has had poping with pain for 2 weeks, without a history of trauma, infection, or allergies. Ears impacted with cerumen bilaterally, occluding ear drum. No history of trauma.   - Debrox (order off of amazon), for 1 week  - Follow up appointment to clean ears if symptoms persist     Bess Kinds, MD [facility]

## 2021-06-16 ENCOUNTER — Other Ambulatory Visit: Payer: Self-pay

## 2021-06-16 ENCOUNTER — Ambulatory Visit (INDEPENDENT_AMBULATORY_CARE_PROVIDER_SITE_OTHER): Payer: BC Managed Care – PPO | Admitting: Student

## 2021-06-16 ENCOUNTER — Encounter: Payer: Self-pay | Admitting: Student

## 2021-06-16 DIAGNOSIS — H938X2 Other specified disorders of left ear: Secondary | ICD-10-CM

## 2021-06-16 NOTE — Assessment & Plan Note (Addendum)
Patient has had poping with pain for 2 weeks, without a history of trauma, infection, or allergies. Ears impacted with cerumen bilaterally, occluding ear drum.   - Debrox (order off of amazon), for 1 week  - Follow up appointment to clean ears if symptoms persist

## 2021-06-16 NOTE — Patient Instructions (Signed)
It was great to see you! Thank you for allowing me to participate in your care!   Our plans for today:  - Ear popping  Debrox - order off amazon and use for 1 week  Follow up appointment if symptoms don't improve  Take care and seek immediate care sooner if you develop any concerns.   Dr. Bess Kinds, MD [facility]

## 2021-07-20 ENCOUNTER — Telehealth: Payer: BC Managed Care – PPO | Admitting: Physician Assistant

## 2021-07-20 DIAGNOSIS — R040 Epistaxis: Secondary | ICD-10-CM

## 2021-07-20 DIAGNOSIS — U071 COVID-19: Secondary | ICD-10-CM | POA: Diagnosis not present

## 2021-07-20 NOTE — Progress Notes (Signed)
Virtual Visit Consent   Renee Lester, you are scheduled for a virtual visit with a [facility] provider today.     Just as with appointments in the office, your consent must be obtained to participate.  Your consent will be active for this visit and any virtual visit you may have with one of our providers in the next 365 days.     If you have a MyChart account, a copy of this consent can be sent to you electronically.  All virtual visits are billed to your insurance company just like a traditional visit in the office.    As this is a virtual visit, video technology does not allow for your provider to perform a traditional examination.  This may limit your provider's ability to fully assess your condition.  If your provider identifies any concerns that need to be evaluated in person or the need to arrange testing (such as labs, EKG, etc.), we will make arrangements to do so.     Although advances in technology are sophisticated, we cannot ensure that it will always work on either your end or our end.  If the connection with a video visit is poor, the visit may have to be switched to a telephone visit.  With either a video or telephone visit, we are not always able to ensure that we have a secure connection.     I need to obtain your verbal consent now.   Are you willing to proceed with your visit today?    [name] has provided verbal consent on 07/20/2021 for a virtual visit (video or telephone).   Renee Lester, [place]   Date: 07/20/2021 2:18 PM   Virtual Visit via Video Note   I, Renee Lester, connected with  Renee Lester  ([id number], Aug 27, 1996) on 07/20/21 at  2:00 PM EDT by a video-enabled telemedicine application and verified that I am speaking with the correct person using two identifiers.  Location: Patient: Virtual Visit Location Patient: Home Provider: Virtual Visit Location Provider: Home Office   I discussed the limitations of evaluation and management  by telemedicine and the availability of in person appointments. The patient expressed understanding and agreed to proceed.    History of Present Illness: Renee Lester is a 25 y.o. who identifies as a female who was assigned female at birth, and is being seen today for COVID and nose bleeds. Patient endorses a history of ear pressure and popping within the last month for which she was seen previously at a in-person UC. Notes she had been doing well up until a few days ago when she thought symptoms were recurring. She then developed nasal congestion, sinus pressure, chills, sweats and feeling feverish. As such she took a home COVID test yesterday which was positive. Last night after her test she blew her nose which caused her to have a bleed from the left nostril that took > 15 minutes to fully stop. Has a milder bleed from same nostril this morning. Denies nasal pain itself.    HPI: HPI  Problems:  Patient Active Problem List   Diagnosis Date Noted   Popping of left ear 06/16/2021   Fatigue 05/05/2021   High risk sexual behavior 03/06/2020   Class 3 severe obesity due to excess calories without serious comorbidity with body mass index (BMI) of 40.0 to 44.9 in adult [facility]) 12/02/2018   Polycystic ovary syndrome 11/26/2018    Allergies: No Known Allergies Medications: No current outpatient  medications on file.  Observations/Objective: Patient is well-developed, well-nourished in no acute distress.  Resting comfortably at home.  Head is normocephalic, atraumatic.  No labored breathing. Speech is clear and coherent with logical content.  Patient is alert and oriented at baseline.   Assessment and Plan: 1. COVID-19 + test. 2 days of milder symptoms in young, healthy individual. Supportive measures and OTC medications reviewed. Vitamin regimen reviewed. Discussed quarantine and ER precautions. She has been enrolled in a MyChart symptom monitoring program.   2. Epistaxis 2 episodes --  unilateral from L nostril. Started after performing a home COVID test so likely irritated the area. Supportive measures reviewed. Start saline rinse and Vaseline to inside of nares. Humidiifer in bedroom. Can use Afrin PRN if recurring and not stopping with pressure. If this occurs she needs in person evaluation.   Follow Up Instructions: I discussed the assessment and treatment plan with the patient. The patient was provided an opportunity to ask questions and all were answered. The patient agreed with the plan and demonstrated an understanding of the instructions.  A copy of instructions were sent to the patient via MyChart.  The patient was advised to call back or seek an in-person evaluation if the symptoms worsen or if the condition fails to improve as anticipated.  Time:  I spent 12 minutes with the patient via telehealth technology discussing the above problems/concerns.    Renee Climes, PA-C

## 2021-07-20 NOTE — Patient Instructions (Signed)
  Renee Lester, thank you for joining Piedad Climes, PA-C for today's virtual visit.  While this provider is not your primary care provider (PCP), if your PCP is located in our provider database this encounter information will be shared with them immediately following your visit.  Consent: (Patient) Renee Lester provided verbal consent for this virtual visit at the beginning of the encounter.  Current Medications: No current outpatient medications on file.   Medications ordered in this encounter:  No orders of the defined types were placed in this encounter.    *If you need refills on other medications prior to your next appointment, please contact your pharmacy*  Follow-Up: Call back or seek an in-person evaluation if the symptoms worsen or if the condition fails to improve as anticipated.  Other Instructions Please keep well-hydrated and get plenty of rest. Start a saline nasal rinse to flush out your nasal passages. You can use plain Mucinex to help thin congestion. If you have a humidifier, running in the bedroom at night. I want you to start OTC vitamin D3 1000 units daily, vitamin C 1000 mg daily, and a zinc supplement. Please take prescribed medications as directed.  You have been enrolled in a MyChart symptom monitoring program. Please answer these questions daily so we can keep track of how you are doing.  You were to quarantine for 5 days from onset of your symptoms.  After day 5, if you have had no fever and you are feeling better, you can end quarantine but need to mask for an additional 5 days. After day 5 if you have a fever or are having significant symptoms, please quarantine for full 10 days.  For the nose bleed -- try to keep hydrated. Start a saline nasal rinse to flush out nasal passages. You can do this once daily. Apply a small amount of vaseline or saline gel just inside the nares to help moisturize and protect the sensitive tissue there to prevent  nosebleeds. Run a humidifier in the bedroom at night. If recurring despite this you need to be evaluated in person.   If you note any worsening of symptoms, any significant shortness of breath or any chest pain, please seek ER evaluation ASAP.  Please do not delay care!    If you have been instructed to have an in-person evaluation today at a local Urgent Care facility, please use the link below. It will take you to a list of all of our available [facility], including address, phone number and hours of operation. Please do not delay care.  [facility]  If you or a family member do not have a primary care provider, use the link below to schedule a visit and establish care. When you choose a [facility] primary care physician or advanced practice provider, you gain a long-term partner in health. Find a Primary Care Provider  Learn more about [facility] in-office and virtual care options: [facility] - Get Care Now

## 2021-07-30 ENCOUNTER — Other Ambulatory Visit: Payer: Self-pay | Admitting: Family Medicine

## 2021-07-30 DIAGNOSIS — Z7251 High risk heterosexual behavior: Secondary | ICD-10-CM

## 2021-10-04 ENCOUNTER — Telehealth: Payer: Self-pay

## 2021-10-04 NOTE — Telephone Encounter (Signed)
Patient calls nurse line regarding late menstrual period. Patient states that she was supposed to get period on 09/28/21.  Patient reports that she is having sore breast and mild abdominal cramping. Reports + home pregnancy test, however, tests were expired.   Scheduled patient for 10/21/21 with PCP, as this was the next available time patient could come into the office.   MAU precautions given.   Veronda Prude, RN

## 2021-10-07 ENCOUNTER — Encounter (HOSPITAL_COMMUNITY): Payer: Self-pay | Admitting: Obstetrics

## 2021-10-07 ENCOUNTER — Other Ambulatory Visit: Payer: Self-pay

## 2021-10-07 ENCOUNTER — Inpatient Hospital Stay (HOSPITAL_COMMUNITY)
Admission: AD | Admit: 2021-10-07 | Discharge: 2021-10-07 | Disposition: A | Discharge status: Home / Self Care | Payer: BC Managed Care – PPO | Attending: Obstetrics | Admitting: Obstetrics

## 2021-10-07 ENCOUNTER — Inpatient Hospital Stay (HOSPITAL_COMMUNITY): Payer: BC Managed Care – PPO

## 2021-10-07 DIAGNOSIS — Z3A01 Less than 8 weeks gestation of pregnancy: Secondary | ICD-10-CM | POA: Insufficient documentation

## 2021-10-07 DIAGNOSIS — N390 Urinary tract infection, site not specified: Secondary | ICD-10-CM | POA: Insufficient documentation

## 2021-10-07 DIAGNOSIS — O2341 Unspecified infection of urinary tract in pregnancy, first trimester: Secondary | ICD-10-CM | POA: Insufficient documentation

## 2021-10-07 DIAGNOSIS — R109 Unspecified abdominal pain: Secondary | ICD-10-CM | POA: Diagnosis present

## 2021-10-07 DIAGNOSIS — O26899 Other specified pregnancy related conditions, unspecified trimester: Secondary | ICD-10-CM

## 2021-10-07 HISTORY — DX: Polycystic ovarian syndrome: E28.2

## 2021-10-07 LAB — POCT PREGNANCY, URINE: Preg Test, Ur: POSITIVE — AB

## 2021-10-07 LAB — URINALYSIS, ROUTINE W REFLEX MICROSCOPIC
Bacteria, UA: NONE SEEN
Bilirubin Urine: NEGATIVE
Glucose, UA: NEGATIVE mg/dL
Hgb urine dipstick: NEGATIVE
Ketones, ur: 80 mg/dL — AB
Leukocytes,Ua: NEGATIVE
Nitrite: POSITIVE — AB
Protein, ur: 30 mg/dL — AB
Specific Gravity, Urine: 1.03 (ref 1.005–1.030)
pH: 5 (ref 5.0–8.0)

## 2021-10-07 LAB — CBC
HCT: 37 % (ref 36.0–46.0)
Hemoglobin: 12.1 g/dL (ref 12.0–15.0)
MCH: 27.8 pg (ref 26.0–34.0)
MCHC: 32.7 g/dL (ref 30.0–36.0)
MCV: 85.1 fL (ref 80.0–100.0)
Platelets: 331 10*3/uL (ref 150–400)
RBC: 4.35 MIL/uL (ref 3.87–5.11)
RDW: 14.1 % (ref 11.5–15.5)
WBC: 9.1 10*3/uL (ref 4.0–10.5)
nRBC: 0 % (ref 0.0–0.2)

## 2021-10-07 LAB — WET PREP, GENITAL
Sperm: NONE SEEN
Trich, Wet Prep: NONE SEEN
WBC, Wet Prep HPF POC: >=10 — AB (ref <10)
Yeast Wet Prep HPF POC: NONE SEEN

## 2021-10-07 LAB — ABO/RH: ABO/RH(D): B POS

## 2021-10-07 LAB — HCG, QUANTITATIVE, PREGNANCY: hCG, Beta Chain, Quant, S: 17056 m[IU]/mL — ABNORMAL HIGH (ref <5)

## 2021-10-07 MED ORDER — CEFADROXIL 500 MG PO CAPS: 500.0000 mg | ORAL_CAPSULE | Freq: Two times a day (BID) | ORAL | 0 refills | Status: AC

## 2021-10-07 NOTE — MAU Provider Note (Signed)
History     CSN: [id number]  Arrival date and time: 10/07/21 1746   Event Date/Time   First Provider Initiated Contact with Patient 10/07/21 1911      Chief Complaint  Patient presents with   Abdominal Pain   HPI Renee Lester is a 25 y.o. G2P0010 at [redacted]w[redacted]d by LMP who presents to [facility] for positive pregnancy test and lower abdominal cramping. Patient reports mesntrual-like cramps for the last 2 weeks. Cramping occurs daily for most parts of the day. Laying down helps with pain. She is not currently having pain but when occurs she rates it 5/10. She denies vaginal bleeding, discharge, or urinary s/s. LMP was 08/28/21. Plans to [facility] care.   OB History     Gravida  2   Para      Term      Preterm      AB  1   Living         SAB  1   IAB      Ectopic  0   Multiple  0   Live Births              Past Medical History:  Diagnosis Date   BV (bacterial vaginosis) 03/06/2020   Increased urinary frequency 12/02/2018   PCOS (polycystic ovarian syndrome)    Vaginal odor 10/21/2019    History reviewed. No pertinent surgical history.  History reviewed. No pertinent family history.  Social History   Tobacco Use   Smoking status: Never   Smokeless tobacco: Never  Substance Use Topics   Alcohol use: No   Drug use: No    Allergies: No Known Allergies  Medications Prior to Admission  Medication Sig Dispense Refill Last Dose   Prenatal Vit-Fe Fumarate-FA (MULTIVITAMIN-PRENATAL) 27-0.8 MG TABS tablet Take 1 tablet by mouth once daily 30 tablet 0 Past Month    Review of Systems  Constitutional: Negative.   Respiratory: Negative.    Cardiovascular: Negative.   Gastrointestinal:  Positive for abdominal pain (cramping).  Genitourinary: Negative.   Musculoskeletal: Negative.   Neurological: Negative.   Physical Exam   Blood pressure 128/68, pulse 95, temperature 98.4 F (36.9 C), temperature source Oral, resp.  rate 18, height 5\' 8"  (1.727 m), weight 134.6 kg, last menstrual period 08/28/2021, SpO2 100 %.  Physical Exam Vitals and nursing note reviewed.  Constitutional:      General: She is not in acute distress.    Appearance: She is obese.  Cardiovascular:     Rate and Rhythm: Normal rate.  Pulmonary:     Effort: Pulmonary effort is normal.  Abdominal:     Palpations: Abdomen is soft.     Tenderness: There is no abdominal tenderness.  Genitourinary:    Comments: Blind swabs obtained Skin:    General: Skin is warm and dry.  Neurological:     General: No focal deficit present.     Mental Status: She is alert and oriented to person, place, and time.  Psychiatric:        Mood and Affect: Mood normal.        Behavior: Behavior normal.   10/28/2021 OB LESS THAN 14 WEEKS WITH OB TRANSVAGINAL  Result Date: 10/07/2021 CLINICAL DATA:  Abdominal pain and cramping. EXAM: OBSTETRIC <14 WK 10/09/2021 AND TRANSVAGINAL OB US TECHNIQUE: Both transabdominal and transvaginal ultrasound examinations were performed for complete evaluation of the gestation as well as the maternal uterus, adnexal regions, and pelvic cul-de-sac. Transvaginal  technique was performed to assess early pregnancy. COMPARISON:  None. FINDINGS: Intrauterine gestational sac: Single Yolk sac:  Visualized. Embryo:  Not Visualized. MSD: 11.6 mm   5 w   6 d Subchorionic hemorrhage:  Small. Maternal uterus/adnexae: Bilateral ovaries are visualized and within normal limits. Corpus luteum is noted in the right ovary. Small amount of free fluid in the pelvis. IMPRESSION: 1. Single intrauterine gestational sac measuring 5 weeks 6 days by mean gestational sac diameter. Yolk sac is present, but fetal pole is not identified at this time which is within normal limits for sac size. Recommend short-term follow-up ultrasound to confirm viability. 2. Small subchorionic hemorrhage. 3. Small amount of free fluid in the pelvis. Electronically Signed   By: Darliss Cheney M.D.    On: 10/07/2021 20:05    MAU Course  Procedures  MDM UA, culture pending. +nitrites CBC, HCG, ABO/RH Wet prep, GC/CT [place] with gestational sac and yolk sac.    Assessment and Plan  [redacted] weeks gestation of pregnancy UTI in pregnancy  - Discharge home in stable condition - Rx for UTI sent to pharmacy, culture pending - Outpatient ultrasound ordered for viability - Strict return precautions reviewed. Return to MAU as needed - Call OB office of choice to schedule prenatal care   Brand Males, CNM 10/07/2021, 8:50 PM

## 2021-10-07 NOTE — MAU Note (Addendum)
Presents stating she's been cramping for 2.5 weeks but currently not having any cramps or pain.  Denies VB.  +HPT.  LMP 08/28/2021

## 2021-10-10 ENCOUNTER — Telehealth: Payer: Self-pay

## 2021-10-10 ENCOUNTER — Other Ambulatory Visit: Payer: Self-pay

## 2021-10-10 DIAGNOSIS — A749 Chlamydial infection, unspecified: Secondary | ICD-10-CM

## 2021-10-10 LAB — GC/CHLAMYDIA PROBE AMP (~~LOC~~) NOT AT ARMC
Chlamydia: POSITIVE — AB
Comment: NEGATIVE
Comment: NORMAL
Neisseria Gonorrhea: NEGATIVE

## 2021-10-10 LAB — CULTURE, OB URINE: Culture: >=100000 — AB

## 2021-10-10 MED ORDER — AZITHROMYCIN 250 MG PO TABS: 1000.0000 mg | ORAL_TABLET | Freq: Once | ORAL | 0 refills | Status: AC

## 2021-10-10 NOTE — Progress Notes (Signed)
Called patient to inform of positive chlamydia results. Name and DOB verified before reviewing results. All questions answered. Rx sent to pharmacy on file.    Renee Lester, CNM 10/10/21 3:12 PM

## 2021-10-10 NOTE — Telephone Encounter (Signed)
Patient calls nurse line requesting lab results from hospital visit on 11/18.  Patient advised to schedule an apt with PCP to confirm pregnancy if she plans to have OB care here.   Will forward to PCP.

## 2021-10-12 NOTE — Telephone Encounter (Signed)
Results already addressed by MAU provider at this time. Agree will need to schedule appt if she wants OB care here.

## 2021-10-17 ENCOUNTER — Other Ambulatory Visit (HOSPITAL_COMMUNITY): Payer: Self-pay

## 2021-10-17 DIAGNOSIS — O2341 Unspecified infection of urinary tract in pregnancy, first trimester: Secondary | ICD-10-CM

## 2021-10-17 DIAGNOSIS — Z3A01 Less than 8 weeks gestation of pregnancy: Secondary | ICD-10-CM

## 2021-10-19 ENCOUNTER — Telehealth: Payer: Self-pay

## 2021-10-19 NOTE — Telephone Encounter (Signed)
Patient calls nurse line regarding possible UTI. Patient reports increased urinary frequency (approx every hour) and abdominal pain. Patient denies back pain, fever, or vaginal bleeding. Patient is currently [redacted] weeks pregnant.   Patient states that she was treated for UTI on 11/18 with Cefadroxil. Patient reports that she began having symptoms again after completing antibiotic.   Scheduled patient for follow up visit tomorrow morning at 11:30. Patient is establishing care with OBGYN for prenatal care and will not be seen until next week.   Strict ED/UC precautions given.   Veronda Prude, RN

## 2021-10-20 ENCOUNTER — Other Ambulatory Visit: Payer: Self-pay

## 2021-10-20 ENCOUNTER — Ambulatory Visit
Admission: RE | Admit: 2021-10-20 | Discharge: 2021-10-20 | Disposition: A | Discharge status: Home / Self Care | Payer: BC Managed Care – PPO | Source: Ambulatory Visit

## 2021-10-20 ENCOUNTER — Ambulatory Visit (INDEPENDENT_AMBULATORY_CARE_PROVIDER_SITE_OTHER): Payer: BC Managed Care – PPO | Admitting: Family Medicine

## 2021-10-20 VITALS — BP 136/86 | HR 103 | Ht 68.0 in | Wt 300.4 lb

## 2021-10-20 DIAGNOSIS — O2341 Unspecified infection of urinary tract in pregnancy, first trimester: Secondary | ICD-10-CM | POA: Insufficient documentation

## 2021-10-20 DIAGNOSIS — Z3A01 Less than 8 weeks gestation of pregnancy: Secondary | ICD-10-CM | POA: Diagnosis not present

## 2021-10-20 DIAGNOSIS — Z3687 Encounter for antenatal screening for uncertain dates: Secondary | ICD-10-CM | POA: Insufficient documentation

## 2021-10-20 DIAGNOSIS — R350 Frequency of micturition: Secondary | ICD-10-CM

## 2021-10-20 LAB — POCT URINALYSIS DIP (MANUAL ENTRY)
Bilirubin, UA: NEGATIVE
Blood, UA: NEGATIVE
Glucose, UA: NEGATIVE mg/dL
Leukocytes, UA: NEGATIVE
Nitrite, UA: NEGATIVE
Protein Ur, POC: NEGATIVE mg/dL
Spec Grav, UA: 1.025 (ref 1.010–1.025)
Urobilinogen, UA: 1 E.U./dL
pH, UA: 6.5 (ref 5.0–8.0)

## 2021-10-20 MED ORDER — PRENATAL MULTIVITAMIN CH: 1.0000 | ORAL_TABLET | Freq: Every day | ORAL | 3 refills | Status: DC

## 2021-10-20 NOTE — Assessment & Plan Note (Addendum)
Patient is a 25 year old G2P0010 who is following with [facility], Dr. Mitzi Hansen.  Dated by early ultrasound 10/07/2021 at 5 weeks 6 days.  Today she is 7 weeks 5 days. -Follow-up OB urine culture -Rx prenatals sent to pharmacy  -Medications reviewed

## 2021-10-20 NOTE — Patient Instructions (Signed)
Congratulations!   You urine did not show evidence of a UTI.  We will send it for culture to be sure.   Start taking prenatal vitamins. Consider taking iron supplements daily with food to ensure your iron stores are normal.   Take Care,  Dr. Rachael Darby

## 2021-10-20 NOTE — Progress Notes (Signed)
   SUBJECTIVE:   CHIEF COMPLAINT / HPI:   Chief Complaint  Patient presents with   Possible uti      Renee Lester is a 25 y.o. female here for urinary frequency.  States that she found out she was pregnant and diagnosed with a UTI on 11/18.  She had an ultrasound that showed that she was 5 weeks 3 days pregnant.  She is okay with being pregnant.  She has not yet started taking prenatal vitamins.  Denies dysuria, fever.  Notes she does have some low back pain and fatigue.    PERTINENT  PMH / PSH: reviewed and updated as appropriate   OBJECTIVE:   BP 136/86   Pulse (!) 103   Ht 5\' 8"  (1.727 m)   Wt (!) 300 lb 6.4 oz (136.3 kg)   LMP 08/28/2021   SpO2 100%   BMI 45.68 kg/m    GEN: well appearing female in no acute distress  CVS: well perfused  RESP: speaking in full sentences without pause, no respiratory distress  GU: Uterus below the umbilicus SKIN: Warm and dry   ASSESSMENT/PLAN:   Less than [redacted] weeks gestation of pregnancy Patient is a 25 year old G2P0010 who is following with [facility], Dr. 22.  Dated by early ultrasound 10/07/2021 at 5 weeks 6 days.  Today she is 7 weeks 5 days. -Follow-up OB urine culture -Rx prenatals sent to pharmacy  -Medications reviewed   Urinary frequency Recently treated with antibiotics for UTI.  She has completed this treatment but continues to have urinary frequency.  UA unremarkable.  Suspect urinary frequency related to concurrent pregnancy.  Will obtain urine culture.   10/09/2021, DO PGY-3, [facility] 10/20/2021

## 2021-10-21 ENCOUNTER — Ambulatory Visit: Payer: BC Managed Care – PPO | Admitting: Family Medicine

## 2021-10-21 ENCOUNTER — Telehealth: Payer: Self-pay | Admitting: Medical

## 2021-10-21 NOTE — Telephone Encounter (Signed)
I called Renee Lester today at 10:42 AM and confirmed patient's identity using two patient identifiers. [place] results from earlier today were reviewed. Patient is scheduled for new OB visit at [facility] next week. First trimester warning signs reviewed. Patient voiced understanding and had no further questions.   US OB Transvaginal  Result Date: 10/20/2021 CLINICAL DATA:  First trimester pregnancy with inconclusive fetal viability. EXAM: TRANSVAGINAL OB ULTRASOUND TECHNIQUE: Transvaginal ultrasound was performed for complete evaluation of the gestation as well as the maternal uterus, adnexal regions, and pelvic cul-de-sac. COMPARISON:  10/07/2021 FINDINGS: Intrauterine gestational sac: Single Yolk sac:  Visualized. Embryo:  Visualized. Cardiac Activity: Visualized. Heart Rate: 137 bpm CRL:   11 mm   7 w 2 d                  [place] EDC: 06/06/2022 Subchorionic hemorrhage:  None visualized. Maternal uterus/adnexae: The right ovary is normal in appearance. The left ovary is not directly visualized, however no mass or abnormal free fluid identified. IMPRESSION: Single living IUP with estimated gestational age of [redacted] weeks 2 days, and [place] EDC of 06/06/2022. Electronically Signed   By: Danae Orleans M.D.   On: 10/20/2021 14:41    Marny Lowenstein, PA-C 10/21/2021 10:42 AM

## 2021-10-22 LAB — CULTURE, OB URINE

## 2021-10-22 LAB — URINE CULTURE, OB REFLEX

## 2021-11-03 ENCOUNTER — Telehealth (INDEPENDENT_AMBULATORY_CARE_PROVIDER_SITE_OTHER): Payer: BC Managed Care – PPO | Admitting: *Deleted

## 2021-11-03 ENCOUNTER — Other Ambulatory Visit: Payer: Self-pay

## 2021-11-03 DIAGNOSIS — O0990 Supervision of high risk pregnancy, unspecified, unspecified trimester: Secondary | ICD-10-CM | POA: Insufficient documentation

## 2021-11-03 DIAGNOSIS — Z3480 Encounter for supervision of other normal pregnancy, unspecified trimester: Secondary | ICD-10-CM

## 2021-11-03 DIAGNOSIS — E282 Polycystic ovarian syndrome: Secondary | ICD-10-CM

## 2021-11-03 DIAGNOSIS — Z3490 Encounter for supervision of normal pregnancy, unspecified, unspecified trimester: Secondary | ICD-10-CM | POA: Insufficient documentation

## 2021-11-03 DIAGNOSIS — Z3A00 Weeks of gestation of pregnancy not specified: Secondary | ICD-10-CM

## 2021-11-03 DIAGNOSIS — O99210 Obesity complicating pregnancy, unspecified trimester: Secondary | ICD-10-CM | POA: Insufficient documentation

## 2021-11-03 NOTE — Progress Notes (Addendum)
New OB Intake  I connected with  Renee Lester on 11/03/21 at 10:24 by MyChart Video Visit and verified that I am speaking with the correct person using two identifiers. Nurse is located at [facility] and pt is located at home.  I discussed the limitations, risks, security and privacy concerns of performing an evaluation and management service by telephone and the availability of in person appointments. I also discussed with the patient that there may be a patient responsible charge related to this service. The patient expressed understanding and agreed to proceed.  I explained I am completing New OB Intake today. We discussed her EDD of 06/04/22 that is based on LMP of 08/28/21. Pt is G2/P0010. I reviewed her allergies, medications, Medical/Surgical/OB history, and appropriate screenings. I informed her of [facility] services. Based on history, this is a/an  pregnancy uncomplicated .   Patient Active Problem List   Diagnosis Date Noted   Supervision of low-risk pregnancy 11/03/2021   Obesity in pregnancy 11/03/2021   Class 3 severe obesity due to excess calories without serious comorbidity with body mass index (BMI) of 40.0 to 44.9 in adult [facility]) 12/02/2018   Polycystic ovary syndrome 11/26/2018    Concerns addressed today  Delivery Plans:  Plans to deliver at [facility].   MyChart/Babyscripts MyChart access verified. I explained pt will have some visits in office and some virtually. Babyscripts instructions given and order placed. Patient verifies receipt of registration text/e-mail. Account successfully created and app downloaded.  Blood Pressure Cuff  Patient has private insurance; instructed to purchase blood pressure cuff and bring to first prenatal appt. Explained after first prenatal appt pt will check weekly and document in Babyscripts.  Weight scale: Patient does have a weight scale.   Anatomy US Explained first scheduled [place] will be around 19 weeks. Anatomy US scheduled for 01/12/22 at  0245. Pt notified to arrive at 0230.  Labs Discussed Avelina Laine genetic screening with patient. Would like both Panorama and Horizon drawn at new OB visit. Routine prenatal labs needed.  Covid Vaccine Patient has not had the covid vaccine.   Centering in Pregnancy Candidate?  If yes, offer as possibility. Offered, patient declines.   Mother/ Baby Dyad Candidate?    If yes, offer as possibility. Offered and patient interested but no openings in July.   Informed patient of [facility] website  and placed link in her AVS.   Social Determinants of Health Food Insecurity: Patient denies food insecurity. WIC Referral: Patient is not interested in referral to [facility]. Has applied and was denied.  Transportation: Patient denies transportation needs. Childcare: Discussed no children allowed at ultrasound appointments. Offered childcare services; patient declines childcare services at this time.  Send link to Pregnancy Navigators   Placed OB Box on problem list and updated  First visit review I reviewed new OB appt with pt. I explained she will have a pelvic exam, ob bloodwork with genetic screening if desired- she is undecided. Explained pt will be seen by Dr. Shawnie Pons at first visit; encounter routed to appropriate provider. Explained that patient will be seen by pregnancy navigator following visit with provider. [facility] information placed in AVS.   Emmanuella Mirante,RN 11/03/2021  11:09 AM

## 2021-11-03 NOTE — Patient Instructions (Addendum)
?  At our [facility], we work as an integrated team, providing care to address both physical and emotional health. Your medical provider may refer you to see our Behavioral Health Clinician (BHC) on the same day you see your medical provider, as availability permits; often scheduled virtually at your convenience.  ?Our BHC is available to all patients, visits generally last between 20-30 minutes, but can be longer or shorter, depending on patient need. The BHC offers help with stress management, coping with symptoms of depression and anxiety, major life changes , sleep issues, changing risky behavior, grief and loss, life stress, working on personal life goals, and  behavioral health issues, as these all affect your overall health and wellness.  ?The BHC is NOT available for the following: FMLA paperwork, court-ordered evaluations, specialty assessments (custody or disability), letters to employers, or obtaining certification for an emotional support animal. The BHC does not provide long-term therapy. ?You have the right to refuse integrated behavioral health services, or to reschedule to see the BHC at a later date.  ?Confidentiality exception: If it is suspected that a child or disabled adult is being abused or neglected, we are required by law to report that to either Child Protective Services or Adult Protective Services.  ?If you have a diagnosis of Bipolar affective disorder, Schizophrenia, or recurrent Major depressive disorder, we will recommend that you establish care with a psychiatrist, as these are lifelong, chronic conditions, and we want your overall emotional health and medications to be more closely monitored. If you anticipate needing extended maternity leave due to mental health issues postpartum, it it recommended you inform your medical provider, so we can put in a referral to a psychiatrist as soon as possible. The BHC is unable to recommend an extended maternity leave for mental  health issues. ?Your medical provider or BHC may refer you to a therapist for ongoing, traditional therapy, or to a psychiatrist, for medication management, if it would benefit your overall health. ?Depending on your insurance, you may have a copay or be charged a deductible, depending on your insurance, to see the BHC. If you are uninsured, it is recommended that you apply for financial assistance. (Forms may be requested at the front desk for in-person visits, via MyChart, or request a form during a virtual visit).  ?If you see the BHC more than 6 times, you will have to complete a comprehensive clinical assessment interview with the BHC to resume integrated services.  ?For virtual visits with the BHC, you must be physically in the state of [place] at the time of the visit. For example, if you live in [place], you will have to do an in-person visit with the BHC, and your out-of-state insurance may not cover behavioral health services in [place]. If you are going out of the state or country for any reason, the BHC may see you virtually when you return to [place], but not while you are physically outside of [place].  ?  ?Here is a link to the Pregnancy Navigators . Please ?Fill out prior to your New OB appointment.  ? ?English Link: [email or web address].[email or web address]?[email or web address] ? ?Spanish Link: [email or web address].tfaforms.[email or web address]?[email or web address]  ?[email or web address] is a website with information to help you prepare for Labor and delivery, patient information, visitor information and more.   ?

## 2021-11-03 NOTE — Progress Notes (Signed)
Patient seen and assessed by nursing staff.  Agree with documentation and plan.  

## 2021-11-20 NOTE — L&D Delivery Note (Addendum)
LABOR COURSE Patient was IOL for gHTN. Patient had foley balloon placed at 1530, which was later removed. Patient was soon after started on Pitocin. Patient had AROM just prior to delivery.   Delivery Note Called to room and patient was complete and pushing. Head delivered ROA. No nuchal cord present. Shoulder and body delivered in usual fashion. At  0139 a viable female was delivered via Vaginal, Spontaneous (Presentation: ROA).  Infant with spontaneous cry, placed on mother's abdomen, dried and stimulated. Cord clamped x 2 after 1-minute delay, and cut by FOB. Cord blood drawn. Placenta delivered spontaneously with gentle cord traction. Appears intact. Fundus firm with massage and Pitocin. Labia, perineum, vagina, and cervix inspected with 1st degree perineal and right peri-urethral laceration. The 1st degree was repaired in usual fashion and the peri-urethral was hemostatic/not repaired.    APGAR: 8, 9   Cord: 3VC with the following complications: None.    Anesthesia:  Epidural Episiotomy: None Lacerations: 1st degree Suture Repair: 3.0 vicryl rapide Est. Blood Loss (mL): 100 cc  Mom to postpartum.  Baby to Couplet care / Skin to Skin.  Bess Kinds, MD, PGY-2 3:03 AM    ATTESTATION  I was present, gloved, and supervising throughout the delivery and agree with above documentation in the resident's note.  Allayne Stack, DO OB Fellow  [facility] [employer] (Faculty Practice) 05/26/2022, 3:05 AM

## 2021-11-22 ENCOUNTER — Telehealth: Payer: Self-pay

## 2021-11-22 ENCOUNTER — Encounter: Payer: Self-pay | Admitting: Family Medicine

## 2021-11-22 NOTE — Telephone Encounter (Signed)
Patient sent MyChart message requesting phone call regarding ongoing stomach pain. Patient is requesting [place]. Called pt who reports stomach pain has been ongoing since 5w of pregnancy. Pt has had 2 [place] this pregnancy; last [place] on 10/20/21 confirmed IUP. Explained additional [place] will only be completed if medically indicated. Explained pt can expect to hear FHR via Doppler at first appt and next [place] will be at 19w for anatomy. Pt states she has been having intermittent sharp pain for last 2 weeks. States she is having bowel movement every 2 to 3 days. Has not tried anything to relieve pain. Encouraged patient to try Miralax daily, may increase to twice a day if no bowel movement. Also recommended patient treat pain with extra strength Tylenol and heat. Pt also encouraged to increase hydration. I explained to patient that any vaginal or urinary infections will be ruled out at first appointment on 12/01/21.

## 2021-12-01 ENCOUNTER — Ambulatory Visit (INDEPENDENT_AMBULATORY_CARE_PROVIDER_SITE_OTHER): Payer: BC Managed Care – PPO | Admitting: Family Medicine

## 2021-12-01 ENCOUNTER — Other Ambulatory Visit (HOSPITAL_COMMUNITY)
Admission: RE | Admit: 2021-12-01 | Discharge: 2021-12-01 | Disposition: A | Discharge status: Home / Self Care | Payer: BC Managed Care – PPO | Source: Ambulatory Visit | Attending: Family Medicine | Admitting: Family Medicine

## 2021-12-01 ENCOUNTER — Other Ambulatory Visit: Payer: Self-pay

## 2021-12-01 ENCOUNTER — Encounter: Payer: Self-pay | Admitting: Family Medicine

## 2021-12-01 VITALS — BP 128/61 | HR 93 | Wt 288.3 lb

## 2021-12-01 DIAGNOSIS — E282 Polycystic ovarian syndrome: Secondary | ICD-10-CM

## 2021-12-01 DIAGNOSIS — Z3490 Encounter for supervision of normal pregnancy, unspecified, unspecified trimester: Secondary | ICD-10-CM | POA: Insufficient documentation

## 2021-12-01 DIAGNOSIS — R8271 Bacteriuria: Secondary | ICD-10-CM | POA: Diagnosis not present

## 2021-12-01 DIAGNOSIS — N760 Acute vaginitis: Secondary | ICD-10-CM | POA: Insufficient documentation

## 2021-12-01 DIAGNOSIS — B9689 Other specified bacterial agents as the cause of diseases classified elsewhere: Secondary | ICD-10-CM | POA: Insufficient documentation

## 2021-12-01 DIAGNOSIS — O99891 Other specified diseases and conditions complicating pregnancy: Secondary | ICD-10-CM

## 2021-12-01 DIAGNOSIS — O99210 Obesity complicating pregnancy, unspecified trimester: Secondary | ICD-10-CM

## 2021-12-01 MED ORDER — ASPIRIN EC 81 MG PO TBEC: 81.0000 mg | DELAYED_RELEASE_TABLET | Freq: Every day | ORAL | 3 refills | Status: DC

## 2021-12-01 NOTE — Progress Notes (Signed)
Subjective:   Renee Lester is a 26 y.o. G2P0010 at [redacted]w[redacted]d by LMP, early ultrasound being seen today for her first obstetrical visit.  Her obstetrical history is significant for obesity and PCOS . Pregnancy history fully reviewed.  Patient reports  abdominal pain and poor appetite .  HISTORY: OB History  Gravida Para Term Preterm AB Living  2 0 0 0 1 0  SAB IAB Ectopic Multiple Live Births  1 0 0 0 0    # Outcome Date GA Lbr Len/2nd Weight Sex Delivery Anes PTL Lv  2 Current           1 SAB 2016 [redacted]w[redacted]d          Last pap smear was  10/21/2019 and was normal Past Medical History:  Diagnosis Date   BV (bacterial vaginosis) 03/06/2020   Increased urinary frequency 12/02/2018   PCOS (polycystic ovarian syndrome)    PCOS (polycystic ovarian syndrome)    Vaginal odor 10/21/2019   Past Surgical History:  Procedure Laterality Date   WISDOM TOOTH EXTRACTION  2012   No family history on file. Social History   Tobacco Use   Smoking status: Never   Smokeless tobacco: Never  Vaping Use   Vaping Use: Never used  Substance Use Topics   Alcohol use: Not Currently    Comment: socially   Drug use: No   No Known Allergies Current Outpatient Medications on File Prior to Visit  Medication Sig Dispense Refill   ergocalciferol (VITAMIN D2) 1.25 MG (50000 UT) capsule ergocalciferol (vitamin D2) 1,250 mcg (50,000 unit) capsule  TAKE 1 CAPSULE BY MOUTH EVERY 7 DAYS FOR 8 WEEKS THEN START DAILY VITAMIN D3 1000IU     Prenatal Vit-Fe Fumarate-FA (PRENATAL MULTIVITAMIN) TABS tablet Take 1 tablet by mouth daily at 12 noon. 90 tablet 3   Prenatal Vit-Fe Fumarate-FA (MULTIVITAMIN-PRENATAL) 27-0.8 MG TABS tablet Take 1 tablet by mouth once daily 30 tablet 0   No current facility-administered medications on file prior to visit.     Exam   Vitals:   12/01/21 1431  BP: 128/61  Pulse: 93  Weight: 288 lb 4.8 oz (130.8 kg)   Fetal Heart Rate (bpm): 158  System: General: well-developed,  well-nourished female in no acute distress   Skin: normal coloration and turgor, no rashes   Neurologic: oriented, normal, negative, normal mood   Extremities: normal strength, tone, and muscle mass, ROM of all joints is normal   HEENT PERRLA, extraocular movement intact and sclera clear, anicteric   Mouth/Teeth mucous membranes moist, pharynx normal without lesions and dental hygiene good   Neck supple and no masses   Cardiovascular: regular rate and rhythm   Respiratory:  no respiratory distress, normal breath sounds   Abdomen: soft, non-tender; bowel sounds normal; no masses,  no organomegaly     Assessment:   Pregnancy: G2P0010 Patient Active Problem List   Diagnosis Date Noted   Supervision of low-risk pregnancy 11/03/2021   Obesity in pregnancy 11/03/2021   Class 3 severe obesity due to excess calories without serious comorbidity with body mass index (BMI) of 40.0 to 44.9 in adult [facility]) 12/02/2018   Polycystic ovary syndrome 11/26/2018     Plan:  1. Encounter for supervision of low-risk pregnancy, antepartum New OB labs - CBC/D/Plt+RPR+Rh+ABO+RubIgG... - Genetic Screening - Hemoglobin A1c - Culture, OB Urine - Cervicovaginal ancillary only( [place])  2. Obesity in pregnancy Plus check A1C - aspirin EC 81 MG tablet; Take 1 tablet (  81 mg total) by mouth daily.  Dispense: 90 tablet; Refill: 3  3. Polycystic ovary syndrome Check A1C   Initial labs drawn. Continue prenatal vitamins. Genetic Screening discussed, NIPS: ordered. Ultrasound discussed; fetal anatomic survey: ordered. Problem list reviewed and updated. The nature of [facility] with multiple MDs and other Advanced Practice Providers was explained to patient; also emphasized that residents, students are part of our team. Routine obstetric precautions reviewed. Return in about 4 weeks (around 12/29/2021) for [facility].

## 2021-12-02 LAB — HCV INTERPRETATION

## 2021-12-02 LAB — CBC/D/PLT+RPR+RH+ABO+RUBIGG...
Antibody Screen: NEGATIVE
Basophils Absolute: 0 10*3/uL (ref 0.0–0.2)
Basos: 0 %
EOS (ABSOLUTE): 0 10*3/uL (ref 0.0–0.4)
Eos: 0 %
HCV Ab: <0.1 s/co ratio (ref 0.0–0.9)
HIV Screen 4th Generation wRfx: NONREACTIVE
Hematocrit: 41.1 % (ref 34.0–46.6)
Hemoglobin: 13.8 g/dL (ref 11.1–15.9)
Hepatitis B Surface Ag: NEGATIVE
Immature Grans (Abs): 0 10*3/uL (ref 0.0–0.1)
Immature Granulocytes: 0 %
Lymphocytes Absolute: 1.1 10*3/uL (ref 0.7–3.1)
Lymphs: 17 %
MCH: 27.5 pg (ref 26.6–33.0)
MCHC: 33.6 g/dL (ref 31.5–35.7)
MCV: 82 fL (ref 79–97)
Monocytes Absolute: 0.4 10*3/uL (ref 0.1–0.9)
Monocytes: 6 %
Neutrophils Absolute: 5.3 10*3/uL (ref 1.4–7.0)
Neutrophils: 77 %
Platelets: 343 10*3/uL (ref 150–450)
RBC: 5.02 x10E6/uL (ref 3.77–5.28)
RDW: 12.6 % (ref 11.7–15.4)
RPR Ser Ql: NONREACTIVE
Rh Factor: POSITIVE
Rubella Antibodies, IGG: 1.85 index (ref >0.99)
WBC: 6.9 10*3/uL (ref 3.4–10.8)

## 2021-12-02 LAB — CERVICOVAGINAL ANCILLARY ONLY
Bacterial Vaginitis (gardnerella): POSITIVE — AB
Candida Glabrata: NEGATIVE
Candida Vaginitis: NEGATIVE
Chlamydia: NEGATIVE
Comment: NEGATIVE
Comment: NEGATIVE
Comment: NEGATIVE
Comment: NEGATIVE
Comment: NEGATIVE
Comment: NORMAL
Neisseria Gonorrhea: NEGATIVE
Trichomonas: NEGATIVE

## 2021-12-02 LAB — HEMOGLOBIN A1C
Est. average glucose Bld gHb Est-mCnc: 103 mg/dL
Hgb A1c MFr Bld: 5.2 % (ref 4.8–5.6)

## 2021-12-04 ENCOUNTER — Encounter: Payer: Self-pay | Admitting: Family Medicine

## 2021-12-04 DIAGNOSIS — O2340 Unspecified infection of urinary tract in pregnancy, unspecified trimester: Secondary | ICD-10-CM | POA: Insufficient documentation

## 2021-12-04 LAB — URINE CULTURE, OB REFLEX

## 2021-12-04 LAB — CULTURE, OB URINE

## 2021-12-04 MED ORDER — METRONIDAZOLE 500 MG PO TABS: 500.0000 mg | ORAL_TABLET | Freq: Two times a day (BID) | ORAL | 0 refills | Status: AC

## 2021-12-04 MED ORDER — CEFADROXIL 500 MG PO CAPS: 500.0000 mg | ORAL_CAPSULE | Freq: Two times a day (BID) | ORAL | 0 refills | Status: AC

## 2021-12-04 NOTE — Addendum Note (Signed)
Addended by: Reva Bores on: 12/04/2021 03:07 PM   Modules accepted: Orders

## 2021-12-04 NOTE — Addendum Note (Signed)
Addended by: Donnamae Jude on: 12/04/2021 09:24 PM   Modules accepted: Orders

## 2021-12-07 ENCOUNTER — Telehealth: Payer: Self-pay

## 2021-12-07 ENCOUNTER — Encounter: Payer: Self-pay | Admitting: Family Medicine

## 2021-12-07 NOTE — Telephone Encounter (Signed)
Pt requested a call via [place].  Called pt and pt wanted to know how to prevent UTI's in pregnancy.  I informed pt that it could be diet, not wiping from front to back, and something that can happen in pregnancy.  I explained to the pt to make sure that she takes her antibiotics as prescribed as a UTI untreated can cause preterm contractions.  Pt also asked about her genetic results.  I advised pt that they take two weeks for results and she will get in MyChart or she can contact the number on the card that was given to her.  Pt verbalized understanding.   Mel Almond, RN  12/07/21

## 2021-12-14 ENCOUNTER — Encounter (HOSPITAL_BASED_OUTPATIENT_CLINIC_OR_DEPARTMENT_OTHER): Payer: Self-pay | Admitting: Obstetrics and Gynecology

## 2021-12-14 ENCOUNTER — Telehealth: Payer: Self-pay | Admitting: Family Medicine

## 2021-12-14 ENCOUNTER — Emergency Department (HOSPITAL_BASED_OUTPATIENT_CLINIC_OR_DEPARTMENT_OTHER)
Admission: EM | Admit: 2021-12-14 | Discharge: 2021-12-14 | Disposition: A | Discharge status: Home / Self Care | Payer: BC Managed Care – PPO | Attending: Emergency Medicine | Admitting: Emergency Medicine

## 2021-12-14 ENCOUNTER — Other Ambulatory Visit: Payer: Self-pay

## 2021-12-14 ENCOUNTER — Encounter: Payer: Self-pay | Admitting: *Deleted

## 2021-12-14 DIAGNOSIS — Z3A17 17 weeks gestation of pregnancy: Secondary | ICD-10-CM | POA: Diagnosis not present

## 2021-12-14 DIAGNOSIS — N9489 Other specified conditions associated with female genital organs and menstrual cycle: Secondary | ICD-10-CM | POA: Diagnosis not present

## 2021-12-14 DIAGNOSIS — Z7982 Long term (current) use of aspirin: Secondary | ICD-10-CM | POA: Insufficient documentation

## 2021-12-14 DIAGNOSIS — O210 Mild hyperemesis gravidarum: Secondary | ICD-10-CM | POA: Diagnosis present

## 2021-12-14 DIAGNOSIS — O219 Vomiting of pregnancy, unspecified: Secondary | ICD-10-CM

## 2021-12-14 LAB — COMPREHENSIVE METABOLIC PANEL
ALT: 14 U/L (ref 0–44)
AST: 17 U/L (ref 15–41)
Albumin: 4 g/dL (ref 3.5–5.0)
Alkaline Phosphatase: 57 U/L (ref 38–126)
Anion gap: 9 (ref 5–15)
BUN: 5 mg/dL — ABNORMAL LOW (ref 6–20)
CO2: 23 mmol/L (ref 22–32)
Calcium: 9.6 mg/dL (ref 8.9–10.3)
Chloride: 101 mmol/L (ref 98–111)
Creatinine, Ser: 0.67 mg/dL (ref 0.44–1.00)
GFR, Estimated: >60 mL/min (ref >60)
Glucose, Bld: 118 mg/dL — ABNORMAL HIGH (ref 70–99)
Potassium: 3.7 mmol/L (ref 3.5–5.1)
Sodium: 133 mmol/L — ABNORMAL LOW (ref 135–145)
Total Bilirubin: 0.5 mg/dL (ref 0.3–1.2)
Total Protein: 7.1 g/dL (ref 6.5–8.1)

## 2021-12-14 LAB — URINALYSIS, ROUTINE W REFLEX MICROSCOPIC
Bilirubin Urine: NEGATIVE
Glucose, UA: NEGATIVE mg/dL
Hgb urine dipstick: NEGATIVE
Ketones, ur: 15 mg/dL — AB
Nitrite: NEGATIVE
Protein, ur: 100 mg/dL — AB
Specific Gravity, Urine: 1.04 — ABNORMAL HIGH (ref 1.005–1.030)
pH: 6 (ref 5.0–8.0)

## 2021-12-14 LAB — LIPASE, BLOOD: Lipase: <10 U/L — ABNORMAL LOW (ref 11–51)

## 2021-12-14 LAB — HCG, QUANTITATIVE, PREGNANCY: hCG, Beta Chain, Quant, S: 111372 m[IU]/mL — ABNORMAL HIGH (ref <5)

## 2021-12-14 NOTE — ED Triage Notes (Signed)
Patient reports to the ER for emesis this morning x2 around 1100. Patient reports she is [redacted] weeks pregnant. Denies bleeding, reports abdominal pain.

## 2021-12-14 NOTE — Discharge Instructions (Addendum)
Please read the information about severe nausea and vomiting in pregnancy attached to these papers.  Please also follow-up with your OB/GYN about your concerns.  There is another office attached to these papers as well.

## 2021-12-14 NOTE — ED Provider Notes (Signed)
[facility] EMERGENCY DEPT Provider Note   CSN: [id number] Arrival date & time: 12/14/21  1651     History  Chief Complaint  Patient presents with   Emesis    Renee Lester is a 26 y.o. female at [redacted] weeks pregnant presenting today with complaint of emesis.  She reports that she threw up once yesterday and twice yesterday and noted it was yellow.  She is concerned for the color, not nauseous or experiencing any abdominal cramping, vaginal bleeding, vaginal discharge or urinary symptoms.  She also is requesting more treatment for bacterial vaginosis and a UTI because she did not take the medications that she was prescribed 2 weeks ago by her OB/GYN.  Denies any symptoms of either illness.   Emesis     Home Medications Prior to Admission medications   Medication Sig Start Date End Date Taking? Authorizing Provider  aspirin EC 81 MG tablet Take 1 tablet (81 mg total) by mouth daily. 12/01/21   Reva Bores, MD  ergocalciferol (VITAMIN D2) 1.25 MG (50000 UT) capsule ergocalciferol (vitamin D2) 1,250 mcg (50,000 unit) capsule  TAKE 1 CAPSULE BY MOUTH EVERY 7 DAYS FOR 8 WEEKS THEN START DAILY VITAMIN D3 1000IU    [provider]  Prenatal Vit-Fe Fumarate-FA (PRENATAL MULTIVITAMIN) TABS tablet Take 1 tablet by mouth daily at 12 noon. 10/20/21   Katha Cabal, DO      Allergies    Patient has no known allergies.    Review of Systems   Review of Systems  Gastrointestinal:  Positive for vomiting.   Physical Exam Updated Vital Signs BP (!) 141/116 (BP Location: Right Arm)    Pulse (!) 111    Temp 98.5 F (36.9 C)    Resp 16    LMP 08/28/2021    SpO2 100%  Physical Exam Vitals and nursing note reviewed.  Constitutional:      Appearance: Normal appearance.  HENT:     Head: Normocephalic and atraumatic.  Eyes:     General: No scleral icterus.    Conjunctiva/sclera: Conjunctivae normal.  Pulmonary:     Effort: Pulmonary effort is normal. No respiratory  distress.  Abdominal:     Tenderness: There is no abdominal tenderness.  Skin:    Findings: No rash.  Neurological:     Mental Status: She is alert.  Psychiatric:        Mood and Affect: Mood normal.    ED Results / Procedures / Treatments   Labs (all labs ordered are listed, but only abnormal results are displayed) Labs Reviewed  LIPASE, BLOOD - Abnormal; Notable for the following components:      Result Value   Lipase <10 (*)    All other components within normal limits  COMPREHENSIVE METABOLIC PANEL - Abnormal; Notable for the following components:   Sodium 133 (*)    Glucose, Bld 118 (*)    BUN 5 (*)    All other components within normal limits  URINALYSIS, ROUTINE W REFLEX MICROSCOPIC - Abnormal; Notable for the following components:   APPearance HAZY (*)    Specific Gravity, Urine 1.040 (*)    Ketones, ur 15 (*)    Protein, ur 100 (*)    Leukocytes,Ua SMALL (*)    All other components within normal limits  HCG, QUANTITATIVE, PREGNANCY - Abnormal; Notable for the following components:   hCG, Beta Chain, Quant, S [id number] (*)    All other components within normal limits    EKG None  Radiology No results found.  Procedures Procedures   Medications Ordered in ED Medications - No data to display  ED Course/ Medical Decision Making/ A&P                           Medical Decision Making Amount and/or Complexity of Data Reviewed Labs: ordered.   26 year old female in her first pregnancy presenting today due to emesis.  She was concerned that there may be something wrong because the vomit was yellow.  She endorsed a decreased appetite due to nausea.  She does not have any signs or symptoms of pregnancy complications.  We discussed that nausea and vomiting is common in pregnancy and that she need not be alarmed if she throws up 1-2 times and less there is blood in the emesis, she has cramping or vaginal bleeding.  She was also notified that I am not comfortable  prescribing antibiotics that she did not finish and that she needs to follow-up with her OB/GYN if she is concerned about their prescriptions.  She reports she did not like how the metronidazole made her feel.  Its been 2+ weeks since this prescription. She is agreeable to calling them but is also requesting a new OB/GYN office at discharge. Patient is asymptomatic at this time.  I believe it is reasonable for her to follow-up with her OB/GYN about her symptoms and concerns.    Final Clinical Impression(s) / ED Diagnoses Final diagnoses:  Nausea and vomiting in pregnancy    Rx / DC Orders Results and diagnoses were explained to the patient. Return precautions discussed in full. Patient had no additional questions and expressed complete understanding.   This chart was dictated using voice recognition software.  Despite best efforts to proofread,  errors can occur which can change the documentation meaning.    Woodroe Chen 12/14/21 1836    Benjiman Core, MD 12/15/21 1240

## 2021-12-14 NOTE — Telephone Encounter (Signed)
Patient want to discuss her test results 

## 2021-12-14 NOTE — Telephone Encounter (Signed)
Returned call to patient; VM not set up. MyChart message sent.

## 2021-12-19 ENCOUNTER — Encounter: Payer: Self-pay | Admitting: Family Medicine

## 2021-12-19 ENCOUNTER — Telehealth: Payer: Self-pay

## 2021-12-19 DIAGNOSIS — D563 Thalassemia minor: Secondary | ICD-10-CM | POA: Insufficient documentation

## 2021-12-19 DIAGNOSIS — Z148 Genetic carrier of other disease: Secondary | ICD-10-CM | POA: Insufficient documentation

## 2021-12-19 DIAGNOSIS — R8271 Bacteriuria: Secondary | ICD-10-CM | POA: Insufficient documentation

## 2021-12-19 DIAGNOSIS — O99891 Other specified diseases and conditions complicating pregnancy: Secondary | ICD-10-CM | POA: Insufficient documentation

## 2021-12-19 MED ORDER — CEFADROXIL 500 MG PO CAPS: 500.0000 mg | ORAL_CAPSULE | Freq: Two times a day (BID) | ORAL | 0 refills | Status: AC

## 2021-12-19 NOTE — Telephone Encounter (Signed)
Called Pt to go over Horizon test results showing that she's a carrier for Alpha Thalassemia & Spinal Muscular Atrophy, no answer, could not leave message, no VM.

## 2021-12-19 NOTE — Addendum Note (Signed)
Addended by: Reva Bores on: 12/19/2021 02:40 PM   Modules accepted: Orders

## 2021-12-19 NOTE — Telephone Encounter (Signed)
Pt returned call about the Horizon test results, she stated that she saw results in My Chart and wanted to see exactly how her Partner actually gets tested, asked if she could bring partner to next visit, she stated yes.

## 2021-12-29 ENCOUNTER — Other Ambulatory Visit: Payer: Self-pay

## 2021-12-29 ENCOUNTER — Ambulatory Visit (INDEPENDENT_AMBULATORY_CARE_PROVIDER_SITE_OTHER): Payer: BC Managed Care – PPO | Admitting: Obstetrics & Gynecology

## 2021-12-29 VITALS — BP 128/71 | HR 123 | Wt 277.7 lb

## 2021-12-29 DIAGNOSIS — O219 Vomiting of pregnancy, unspecified: Secondary | ICD-10-CM

## 2021-12-29 DIAGNOSIS — O99210 Obesity complicating pregnancy, unspecified trimester: Secondary | ICD-10-CM

## 2021-12-29 DIAGNOSIS — Z3490 Encounter for supervision of normal pregnancy, unspecified, unspecified trimester: Secondary | ICD-10-CM

## 2021-12-29 MED ORDER — ONDANSETRON 4 MG PO TBDP: 4.0000 mg | ORAL_TABLET | Freq: Four times a day (QID) | ORAL | 0 refills | Status: DC | PRN

## 2021-12-29 NOTE — Progress Notes (Signed)
Patient with asymptomatic bacteriuria and BV--rx sent in for both.

## 2021-12-29 NOTE — Progress Notes (Signed)
° °  PRENATAL VISIT NOTE  Subjective:  Renee Lester is a 26 y.o. G2P0010 at [redacted]w[redacted]d being seen today for ongoing prenatal care.  She is currently monitored for the following issues for this low-risk pregnancy and has Polycystic ovary syndrome; Class 3 severe obesity due to excess calories without serious comorbidity with body mass index (BMI) of 40.0 to 44.9 in adult [facility]); Supervision of low-risk pregnancy; Obesity in pregnancy; UTI in pregnancy, antepartum; Alpha thalassemia silent carrier; Carrier of spinal muscular atrophy; and Asymptomatic bacteriuria during pregnancy in second trimester on their problem list.  Patient reports nausea and vomiting.  Contractions: Not present. Vag. Bleeding: None.  Movement: Absent. Denies leaking of fluid.   The following portions of the patient's history were reviewed and updated as appropriate: allergies, current medications, past family history, past medical history, past social history, past surgical history and problem list.   Objective:   Vitals:   12/29/21 1441  BP: 128/71  Pulse: (!) 123  Weight: 277 lb 11.2 oz (126 kg)    Fetal Status: Fetal Heart Rate (bpm): 150   Movement: Absent     General:  Alert, oriented and cooperative. Patient is in no acute distress.  Skin: Skin is warm and dry. No rash noted.   Cardiovascular: Normal heart rate noted  Respiratory: Normal respiratory effort, no problems with respiration noted  Abdomen: Soft, gravid, appropriate for gestational age.  Pain/Pressure: Present     Pelvic: Cervical exam deferred        Extremities: Normal range of motion.  Edema: None  Mental Status: Normal mood and affect. Normal behavior. Normal judgment and thought content.   Assessment and Plan:  Pregnancy: G2P0010 at [redacted]w[redacted]d 1. Encounter for supervision of low-risk pregnancy, antepartum N&V - AFP, Serum, Open Spina Bifida - ondansetron (ZOFRAN-ODT) 4 MG disintegrating tablet; Take 1 tablet (4 mg total) by mouth every 6 (six)  hours as needed for nausea.  Dispense: 20 tablet; Refill: 0  2. Obesity in pregnancy Weight loss  3. Nausea and vomiting during pregnancy Meds ordered this encounter  Medications   ondansetron (ZOFRAN-ODT) 4 MG disintegrating tablet    Sig: Take 1 tablet (4 mg total) by mouth every 6 (six) hours as needed for nausea.    Dispense:  20 tablet    Refill:  0     Preterm labor symptoms and general obstetric precautions including but not limited to vaginal bleeding, contractions, leaking of fluid and fetal movement were reviewed in detail with the patient. Please refer to After Visit Summary for other counseling recommendations.   Return in about 4 weeks (around 01/26/2022).  Future Appointments  Date Time Provider Department Center  01/12/2022  2:30 PM [facility] NURSE [facility]  01/12/2022  2:45 PM [facility]    Scheryl Darter, MD

## 2021-12-29 NOTE — Addendum Note (Signed)
Encounter addended by: Reva Bores, MD on: 12/29/2021 2:45 PM  Actions taken: Clinical Note Signed

## 2021-12-31 LAB — AFP, SERUM, OPEN SPINA BIFIDA
AFP MoM: 1.54
AFP Value: 57.1 ng/mL
Gest. Age on Collection Date: 18.4 weeks
Maternal Age At EDD: 25.6 yr
OSBR Risk 1 IN: 4914
Test Results:: NEGATIVE
Weight: 277 [lb_av]

## 2022-01-05 ENCOUNTER — Ambulatory Visit (INDEPENDENT_AMBULATORY_CARE_PROVIDER_SITE_OTHER): Payer: BC Managed Care – PPO | Admitting: Family Medicine

## 2022-01-05 ENCOUNTER — Encounter: Payer: Self-pay | Admitting: Family Medicine

## 2022-01-05 ENCOUNTER — Other Ambulatory Visit: Payer: Self-pay

## 2022-01-05 VITALS — BP 134/71 | HR 123 | Wt 280.4 lb

## 2022-01-05 DIAGNOSIS — R350 Frequency of micturition: Secondary | ICD-10-CM | POA: Diagnosis not present

## 2022-01-05 DIAGNOSIS — R000 Tachycardia, unspecified: Secondary | ICD-10-CM

## 2022-01-05 DIAGNOSIS — Z3A18 18 weeks gestation of pregnancy: Secondary | ICD-10-CM

## 2022-01-05 LAB — POCT URINALYSIS DIP (MANUAL ENTRY)
Blood, UA: NEGATIVE
Glucose, UA: NEGATIVE mg/dL
Leukocytes, UA: NEGATIVE
Nitrite, UA: NEGATIVE
Protein Ur, POC: NEGATIVE mg/dL
Spec Grav, UA: 1.025 (ref 1.010–1.025)
Urobilinogen, UA: 1 E.U./dL
pH, UA: 6 (ref 5.0–8.0)

## 2022-01-05 NOTE — Patient Instructions (Signed)
Thank for coming in today.  We repeated your urinalysis it does not suggest a urinary tract infection at this time.  We will follow-up on the urine culture if positive we will give you a call otherwise I will communicate via MyChart.  Continue to follow-up with your OB/GYN for your prenatal care.  Dr. Salvadore Dom

## 2022-01-05 NOTE — Progress Notes (Signed)
° ° °  SUBJECTIVE:   CHIEF COMPLAINT / HPI:   Follow up for UTI Treated for UTI 1/15. Continues to have urinary frequency. She endorses nausea and poor PO intake with this pregnancy. Denies fever, back pain, emesis, dysuria. Has white vaginal discharge but feels it is normal. Has not felt fetal movement but denies vaginal bleeding or leakage of fluid.  PERTINENT  PMH / PSH: G2P0010 @ [redacted]w[redacted]d  OBJECTIVE:   BP 134/71    Pulse (!) 123    Wt 280 lb 6.4 oz (127.2 kg)    LMP 08/28/2021    SpO2 99%    BMI 42.63 kg/m   Urinalysis    Component Value Date/Time   COLORURINE YELLOW 12/14/2021 1718   APPEARANCEUR HAZY (A) 12/14/2021 1718   LABSPEC 1.040 (H) 12/14/2021 1718   PHURINE 6.0 12/14/2021 1718   GLUCOSEU NEGATIVE 12/14/2021 1718   HGBUR NEGATIVE 12/14/2021 1718   BILIRUBINUR small (A) 01/05/2022 1519   KETONESUR large (80) (A) 01/05/2022 1519   KETONESUR 15 (A) 12/14/2021 1718   PROTEINUR negative 01/05/2022 1519   PROTEINUR 100 (A) 12/14/2021 1718   UROBILINOGEN 1.0 01/05/2022 1519   UROBILINOGEN 2.0 (H) 09/22/2019 1758   NITRITE Negative 01/05/2022 1519   NITRITE NEGATIVE 12/14/2021 1718   LEUKOCYTESUR Negative 01/05/2022 1519   LEUKOCYTESUR SMALL (A) 12/14/2021 1718   FHT: 150 Physical Exam Vitals reviewed.  Constitutional:      General: She is not in acute distress.    Appearance: She is not ill-appearing, toxic-appearing or diaphoretic.  Pulmonary:     Effort: Pulmonary effort is normal.  Abdominal:     Tenderness: There is no abdominal tenderness.     Comments: gravid  Neurological:     Mental Status: She is alert and oriented to person, place, and time.  Psychiatric:        Mood and Affect: Mood normal.        Behavior: Behavior normal.   ASSESSMENT/PLAN:  1. Frequent urination Asymptomatic bacteruria 12/04/21 treated with 7 day course of duricef. TOC today. UA w/ ketonuria and proteinuria. Encouraged to eat smaller meals throughout the day.  - POCT urinalysis  dipstick - Culture, OB Urine - Follow up with OB GYN for prenatal care  2. Tachycardia Appears to be chronic over the last 7 months. Noted on last visit. Will need to continue to monitor during pregnancy and thereafter. Consider EKG if symptomatic. TSH postpartum if persistent.   3. [redacted] weeks gestation of pregnancy FHT appropriate. 2/9 visit reviewed. Taking PNV, ASA, zofran PRN. Follows with [facility].  - Follow up with O/B GYN for prenatal care  Gerlene Fee, [place]

## 2022-01-07 LAB — URINE CULTURE, OB REFLEX: Organism ID, Bacteria: NO GROWTH

## 2022-01-07 LAB — CULTURE, OB URINE

## 2022-01-12 ENCOUNTER — Ambulatory Visit: Payer: BC Managed Care – PPO | Admitting: *Deleted

## 2022-01-12 ENCOUNTER — Other Ambulatory Visit: Payer: Self-pay

## 2022-01-12 ENCOUNTER — Ambulatory Visit: Payer: BC Managed Care – PPO | Attending: Family Medicine

## 2022-01-12 VITALS — BP 114/74 | HR 118

## 2022-01-12 DIAGNOSIS — O2340 Unspecified infection of urinary tract in pregnancy, unspecified trimester: Secondary | ICD-10-CM

## 2022-01-12 DIAGNOSIS — Z363 Encounter for antenatal screening for malformations: Secondary | ICD-10-CM | POA: Insufficient documentation

## 2022-01-12 DIAGNOSIS — Z3490 Encounter for supervision of normal pregnancy, unspecified, unspecified trimester: Secondary | ICD-10-CM | POA: Diagnosis not present

## 2022-01-12 DIAGNOSIS — O99891 Other specified diseases and conditions complicating pregnancy: Secondary | ICD-10-CM

## 2022-01-12 DIAGNOSIS — O99210 Obesity complicating pregnancy, unspecified trimester: Secondary | ICD-10-CM | POA: Diagnosis not present

## 2022-01-12 DIAGNOSIS — D563 Thalassemia minor: Secondary | ICD-10-CM

## 2022-01-12 DIAGNOSIS — Z148 Genetic carrier of other disease: Secondary | ICD-10-CM | POA: Insufficient documentation

## 2022-01-12 DIAGNOSIS — R8271 Bacteriuria: Secondary | ICD-10-CM

## 2022-01-12 DIAGNOSIS — O99212 Obesity complicating pregnancy, second trimester: Secondary | ICD-10-CM | POA: Insufficient documentation

## 2022-01-12 DIAGNOSIS — Z3A19 19 weeks gestation of pregnancy: Secondary | ICD-10-CM | POA: Insufficient documentation

## 2022-01-12 DIAGNOSIS — Z3492 Encounter for supervision of normal pregnancy, unspecified, second trimester: Secondary | ICD-10-CM

## 2022-01-13 ENCOUNTER — Other Ambulatory Visit: Payer: Self-pay | Admitting: *Deleted

## 2022-01-13 DIAGNOSIS — Z6841 Body Mass Index (BMI) 40.0 and over, adult: Secondary | ICD-10-CM

## 2022-01-13 DIAGNOSIS — D563 Thalassemia minor: Secondary | ICD-10-CM

## 2022-01-16 ENCOUNTER — Telehealth: Payer: Self-pay | Admitting: Family Medicine

## 2022-01-16 NOTE — Telephone Encounter (Signed)
Call placed back to pt. Spoke with pt. Pt states Natera told her to have partner blood drawn here at [facility]. Pt advised partner can not come here to our office to have drawn. Pt given Mardella Layman at [facility] phone number for pt to contact.  Pt verbalized understanding.  Laney Pastor

## 2022-01-16 NOTE — Telephone Encounter (Signed)
Patient said she received results that her or the baby's father was a Carrier( she said she didn't remember) , and patient is very concerned and want to know what she need to do next

## 2022-01-26 ENCOUNTER — Other Ambulatory Visit: Payer: Self-pay

## 2022-01-26 ENCOUNTER — Encounter: Payer: Self-pay | Admitting: Obstetrics and Gynecology

## 2022-01-26 ENCOUNTER — Ambulatory Visit (INDEPENDENT_AMBULATORY_CARE_PROVIDER_SITE_OTHER): Payer: BC Managed Care – PPO | Admitting: Obstetrics and Gynecology

## 2022-01-26 VITALS — BP 116/76 | HR 116 | Wt 282.5 lb

## 2022-01-26 DIAGNOSIS — D563 Thalassemia minor: Secondary | ICD-10-CM

## 2022-01-26 DIAGNOSIS — Z3A21 21 weeks gestation of pregnancy: Secondary | ICD-10-CM

## 2022-01-26 DIAGNOSIS — Z148 Genetic carrier of other disease: Secondary | ICD-10-CM

## 2022-01-26 DIAGNOSIS — Z3492 Encounter for supervision of normal pregnancy, unspecified, second trimester: Secondary | ICD-10-CM

## 2022-01-26 NOTE — Progress Notes (Signed)
Subjective:  ?Renee Lester is a 26 y.o. G2P0010 at 31w4dbeing seen today for ongoing prenatal care.  She is currently monitored for the following issues for this low-risk pregnancy and has Polycystic ovary syndrome; Class 3 severe obesity due to excess calories without serious comorbidity with body mass index (BMI) of 40.0 to 44.9 in adult ([facility]; Supervision of low-risk pregnancy; Obesity in pregnancy; UTI in pregnancy, antepartum; Alpha thalassemia silent carrier; Carrier of spinal muscular atrophy; and Asymptomatic bacteriuria during pregnancy in second trimester on their problem list. ? ?Patient reports no complaints.  Contractions: Not present. Vag. Bleeding: None.  Movement: Present. Denies leaking of fluid.  ? ?The following portions of the patient's history were reviewed and updated as appropriate: allergies, current medications, past family history, past medical history, past social history, past surgical history and problem list. Problem list updated. ? ?Objective:  ? ?Vitals:  ? 01/26/22 1609  ?BP: 116/76  ?Pulse: (!) 116  ?Weight: 282 lb 8 oz (128.1 kg)  ? ? ?Fetal Status: Fetal Heart Rate (bpm): 136   Movement: Present    ? ?General:  Alert, oriented and cooperative. Patient is in no acute distress.  ?Skin: Skin is warm and dry. No rash noted.   ?Cardiovascular: Normal heart rate noted  ?Respiratory: Normal respiratory effort, no problems with respiration noted  ?Abdomen: Soft, gravid, appropriate for gestational age. Pain/Pressure: Absent     ?Pelvic:  Cervical exam deferred        ?Extremities: Normal range of motion.  Edema: None  ?Mental Status: Normal mood and affect. Normal behavior. Normal judgment and thought content.  ? ?Urinalysis:     ? ?Assessment and Plan:  ?Pregnancy: G2P0010 at 290w4d ?1. Encounter for supervision of low-risk pregnancy in second trimester ?Stable ?F/U growth scan ordered ? ?2. Carrier of spinal muscular atrophy ?Kit for partner testing has been provided to pt ? ?3.  Alpha thalassemia silent carrier ?See above ? ?Preterm labor symptoms and general obstetric precautions including but not limited to vaginal bleeding, contractions, leaking of fluid and fetal movement were reviewed in detail with the patient. ?Please refer to After Visit Summary for other counseling recommendations.  ?Return in about 4 weeks (around 02/23/2022) for OB visit, face to face, any provider. ? ? ?ErChancy MilroyMD ?

## 2022-01-26 NOTE — Patient Instructions (Signed)

## 2022-01-30 ENCOUNTER — Other Ambulatory Visit: Payer: Self-pay | Admitting: Obstetrics & Gynecology

## 2022-01-30 DIAGNOSIS — Z3490 Encounter for supervision of normal pregnancy, unspecified, unspecified trimester: Secondary | ICD-10-CM

## 2022-01-31 MED ORDER — ONDANSETRON 4 MG PO TBDP: 4.0000 mg | ORAL_TABLET | Freq: Four times a day (QID) | ORAL | 0 refills | Status: DC | PRN

## 2022-02-08 ENCOUNTER — Telehealth: Payer: Self-pay | Admitting: Family Medicine

## 2022-02-08 ENCOUNTER — Encounter: Payer: Self-pay | Admitting: *Deleted

## 2022-02-08 ENCOUNTER — Encounter (HOSPITAL_COMMUNITY): Payer: Self-pay | Admitting: Obstetrics and Gynecology

## 2022-02-08 ENCOUNTER — Inpatient Hospital Stay (HOSPITAL_COMMUNITY)
Admission: AD | Admit: 2022-02-08 | Discharge: 2022-02-09 | Disposition: A | Discharge status: Home / Self Care | Payer: BC Managed Care – PPO | Attending: Obstetrics and Gynecology | Admitting: Obstetrics and Gynecology

## 2022-02-08 ENCOUNTER — Other Ambulatory Visit: Payer: Self-pay

## 2022-02-08 DIAGNOSIS — O26892 Other specified pregnancy related conditions, second trimester: Secondary | ICD-10-CM | POA: Diagnosis not present

## 2022-02-08 DIAGNOSIS — M7918 Myalgia, other site: Secondary | ICD-10-CM

## 2022-02-08 DIAGNOSIS — Z7982 Long term (current) use of aspirin: Secondary | ICD-10-CM | POA: Insufficient documentation

## 2022-02-08 DIAGNOSIS — Z148 Genetic carrier of other disease: Secondary | ICD-10-CM

## 2022-02-08 DIAGNOSIS — Z3A23 23 weeks gestation of pregnancy: Secondary | ICD-10-CM | POA: Diagnosis not present

## 2022-02-08 DIAGNOSIS — D563 Thalassemia minor: Secondary | ICD-10-CM

## 2022-02-08 DIAGNOSIS — O2340 Unspecified infection of urinary tract in pregnancy, unspecified trimester: Secondary | ICD-10-CM

## 2022-02-08 DIAGNOSIS — R109 Unspecified abdominal pain: Secondary | ICD-10-CM | POA: Diagnosis present

## 2022-02-08 DIAGNOSIS — Z3492 Encounter for supervision of normal pregnancy, unspecified, second trimester: Secondary | ICD-10-CM

## 2022-02-08 DIAGNOSIS — O99210 Obesity complicating pregnancy, unspecified trimester: Secondary | ICD-10-CM

## 2022-02-08 DIAGNOSIS — O99891 Other specified diseases and conditions complicating pregnancy: Secondary | ICD-10-CM

## 2022-02-08 LAB — CBC
HCT: 34.3 % — ABNORMAL LOW (ref 36.0–46.0)
Hemoglobin: 11.8 g/dL — ABNORMAL LOW (ref 12.0–15.0)
MCH: 28.5 pg (ref 26.0–34.0)
MCHC: 34.4 g/dL (ref 30.0–36.0)
MCV: 82.9 fL (ref 80.0–100.0)
Platelets: 324 10*3/uL (ref 150–400)
RBC: 4.14 MIL/uL (ref 3.87–5.11)
RDW: 13.6 % (ref 11.5–15.5)
WBC: 10.4 10*3/uL (ref 4.0–10.5)
nRBC: 0 % (ref 0.0–0.2)

## 2022-02-08 LAB — URINALYSIS, ROUTINE W REFLEX MICROSCOPIC
Bacteria, UA: NONE SEEN
Glucose, UA: NEGATIVE mg/dL
Hgb urine dipstick: NEGATIVE
Ketones, ur: 5 mg/dL — AB
Leukocytes,Ua: NEGATIVE
Nitrite: NEGATIVE
Protein, ur: 30 mg/dL — AB
Specific Gravity, Urine: 1.032 — ABNORMAL HIGH (ref 1.005–1.030)
pH: 5 (ref 5.0–8.0)

## 2022-02-08 LAB — COMPREHENSIVE METABOLIC PANEL
ALT: 25 U/L (ref 0–44)
AST: 26 U/L (ref 15–41)
Albumin: 3.1 g/dL — ABNORMAL LOW (ref 3.5–5.0)
Alkaline Phosphatase: 74 U/L (ref 38–126)
Anion gap: 8 (ref 5–15)
BUN: 5 mg/dL — ABNORMAL LOW (ref 6–20)
CO2: 23 mmol/L (ref 22–32)
Calcium: 9.2 mg/dL (ref 8.9–10.3)
Chloride: 103 mmol/L (ref 98–111)
Creatinine, Ser: 0.73 mg/dL (ref 0.44–1.00)
GFR, Estimated: >60 mL/min (ref >60)
Glucose, Bld: 92 mg/dL (ref 70–99)
Potassium: 3.9 mmol/L (ref 3.5–5.1)
Sodium: 134 mmol/L — ABNORMAL LOW (ref 135–145)
Total Bilirubin: 0.6 mg/dL (ref 0.3–1.2)
Total Protein: 6.6 g/dL (ref 6.5–8.1)

## 2022-02-08 LAB — LIPASE, BLOOD: Lipase: 26 U/L (ref 11–51)

## 2022-02-08 MED ORDER — ACETAMINOPHEN 500 MG PO TABS
1000.0000 mg | ORAL_TABLET | Freq: Once | ORAL | Status: AC
Start: 2022-02-08 — End: 2022-02-08
  Administered 2022-02-08: 1000 mg via ORAL
  Filled 2022-02-08: qty 2

## 2022-02-08 MED ORDER — IBUPROFEN 600 MG PO TABS
600.0000 mg | ORAL_TABLET | Freq: Once | ORAL | Status: AC
  Administered 2022-02-08: 600 mg via ORAL
  Filled 2022-02-08: qty 1

## 2022-02-08 NOTE — MAU Note (Signed)
.  Renee Lester is a 27 y.o. at [redacted]w[redacted]d here in MAU reporting: with c/o right sided abdominal pain that radiates to the side.  NO VB or LOF.  +FM  ?LMP: 08/28/2021  ?Onset of complaint: Saturday  ?Pain score: 9/10 ?Vitals:  ? 02/08/22 2124  ?BP: 116/79  ?Pulse: (!) 107  ?Temp: 98 ?F (36.7 ?C)  ?SpO2: 100%  ?   ?FHT:147 ?Lab orders placed from triage:   UA ?

## 2022-02-08 NOTE — Telephone Encounter (Signed)
Patient state she have been having side pain and want to know what she can do or take.  ?

## 2022-02-08 NOTE — MAU Provider Note (Signed)
Chief Complaint:  Abdominal Pain ? ? Event Date/Time  ? First Provider Initiated Contact with Patient 02/08/22 2153   ?  ? ?HPI: Renee Lester is a 26 y.o. G2P0010 at 553w3d by LMP who presents to maternity admissions reporting onset of right side pain 4 days ago that is sharp, radiates down her right side into her right abdomen and groin area. It is worse with movement and resolves at rest.  She denies any known injury or change in activity before the onset of pain. She woke up with the pain on Saturday morning.  There are no associated symptoms. She is feeling normal fetal movement. ? ? ?HPI ? ?Past Medical History: ?Past Medical History:  ?Diagnosis Date  ? BV (bacterial vaginosis) 03/06/2020  ? Increased urinary frequency 12/02/2018  ? PCOS (polycystic ovarian syndrome)   ? PCOS (polycystic ovarian syndrome)   ? PCOS (polycystic ovarian syndrome)   ? Vaginal odor 10/21/2019  ? ? ?Past obstetric history: ?OB History  ?Gravida Para Term Preterm AB Living  ?2       1    ?SAB IAB Ectopic Multiple Live Births  ?1   0 0    ?  ?# Outcome Date GA Lbr Len/2nd Weight Sex Delivery Anes PTL Lv  ?2 Current           ?1 SAB 2016 4834w0d         ? ? ?Past Surgical History: ?Past Surgical History:  ?Procedure Laterality Date  ? WISDOM TOOTH EXTRACTION  2012  ? ? ?Family History: ?Family History  ?Problem Relation Age of Onset  ? Cancer Neg Hx   ? Diabetes Neg Hx   ? Heart disease Neg Hx   ? Hyperlipidemia Neg Hx   ? Obesity Neg Hx   ? Stroke Neg Hx   ? ? ?Social History: ?Social History  ? ?Tobacco Use  ? Smoking status: Never  ? Smokeless tobacco: Never  ?Vaping Use  ? Vaping Use: Never used  ?Substance Use Topics  ? Alcohol use: Not Currently  ?  Comment: socially  ? Drug use: No  ? ? ?Allergies: No Known Allergies ? ?Meds:  ?Medications Prior to Admission  ?Medication Sig Dispense Refill Last Dose  ? aspirin EC 81 MG tablet Take 1 tablet (81 mg total) by mouth daily. 90 tablet 3   ? ergocalciferol (VITAMIN D2) 1.25 MG (50000  UT) capsule ergocalciferol (vitamin D2) 1,250 mcg (50,000 unit) capsule ? TAKE 1 CAPSULE BY MOUTH EVERY 7 DAYS FOR 8 WEEKS THEN START DAILY VITAMIN D3 1000IU     ? ondansetron (ZOFRAN-ODT) 4 MG disintegrating tablet Take 1 tablet (4 mg total) by mouth every 6 (six) hours as needed for nausea. 20 tablet 0   ? Prenatal Vit-Fe Fumarate-FA (PRENATAL MULTIVITAMIN) TABS tablet Take 1 tablet by mouth daily at 12 noon. 90 tablet 3   ? ? ?ROS:  ?Review of Systems  ?Constitutional:  Negative for chills, fatigue and fever.  ?Eyes:  Negative for visual disturbance.  ?Respiratory:  Negative for shortness of breath.   ?Cardiovascular:  Negative for chest pain.  ?Gastrointestinal:  Positive for abdominal pain. Negative for nausea and vomiting.  ?Genitourinary:  Positive for flank pain. Negative for difficulty urinating, dysuria, pelvic pain, vaginal bleeding, vaginal discharge and vaginal pain.  ?Neurological:  Negative for dizziness and headaches.  ?Psychiatric/Behavioral: Negative.    ? ? ?I have reviewed patient's Past Medical Hx, Surgical Hx, Family Hx, Social Hx, medications and allergies.  ? ?  Physical Exam  ?Patient Vitals for the past 24 hrs: ? BP Temp Temp src Pulse SpO2 Height Weight  ?02/08/22 2124 116/79 98 ?F (36.7 ?C) Oral (!) 107 100 % 5\' 8"  (1.727 m) 127.3 kg  ? ?Constitutional: Well-developed, well-nourished female in no acute distress.  ?Cardiovascular: normal rate ?Respiratory: normal effort ?GI: Abd soft, non-tender, gravid appropriate for gestational age.  ?MS: Extremities nontender, no edema, normal ROM.  Tenderness to palpation of right side, under ribs and around to right lower abdomen. ?Neurologic: Alert and oriented x 4.  ?GU: Neg CVAT. ? ?Dilation: Closed ?Effacement (%): Thick ?Exam by:: L Leftwich-Kirby CNM ? ?FHT:  Baseline 145 , moderate variability, accelerations present, no decelerations ?Contractions: None on toco or to palpation ?  ?Labs: ?Results for orders placed or performed during the hospital  encounter of 02/08/22 (from the past 24 hour(s))  ?Urinalysis, Routine w reflex microscopic Urine, Clean Catch     Status: Abnormal  ? Collection Time: 02/08/22  9:29 PM  ?Result Value Ref Range  ? Color, Urine AMBER (A) YELLOW  ? APPearance HAZY (A) CLEAR  ? Specific Gravity, Urine 1.032 (H) 1.005 - 1.030  ? pH 5.0 5.0 - 8.0  ? Glucose, UA NEGATIVE NEGATIVE mg/dL  ? Hgb urine dipstick NEGATIVE NEGATIVE  ? Bilirubin Urine SMALL (A) NEGATIVE  ? Ketones, ur 5 (A) NEGATIVE mg/dL  ? Protein, ur 30 (A) NEGATIVE mg/dL  ? Nitrite NEGATIVE NEGATIVE  ? Leukocytes,Ua NEGATIVE NEGATIVE  ? RBC / HPF 0-5 0 - 5 RBC/hpf  ? WBC, UA 0-5 0 - 5 WBC/hpf  ? Bacteria, UA NONE SEEN NONE SEEN  ? Squamous Epithelial / LPF 0-5 0 - 5  ? Mucus PRESENT   ?CBC     Status: Abnormal  ? Collection Time: 02/08/22 10:16 PM  ?Result Value Ref Range  ? WBC 10.4 4.0 - 10.5 K/uL  ? RBC 4.14 3.87 - 5.11 MIL/uL  ? Hemoglobin 11.8 (L) 12.0 - 15.0 g/dL  ? HCT 34.3 (L) 36.0 - 46.0 %  ? MCV 82.9 80.0 - 100.0 fL  ? MCH 28.5 26.0 - 34.0 pg  ? MCHC 34.4 30.0 - 36.0 g/dL  ? RDW 13.6 11.5 - 15.5 %  ? Platelets 324 150 - 400 K/uL  ? nRBC 0.0 0.0 - 0.2 %  ?Comprehensive metabolic panel     Status: Abnormal  ? Collection Time: 02/08/22 10:16 PM  ?Result Value Ref Range  ? Sodium 134 (L) 135 - 145 mmol/L  ? Potassium 3.9 3.5 - 5.1 mmol/L  ? Chloride 103 98 - 111 mmol/L  ? CO2 23 22 - 32 mmol/L  ? Glucose, Bld 92 70 - 99 mg/dL  ? BUN 5 (L) 6 - 20 mg/dL  ? Creatinine, Ser 0.73 0.44 - 1.00 mg/dL  ? Calcium 9.2 8.9 - 10.3 mg/dL  ? Total Protein 6.6 6.5 - 8.1 g/dL  ? Albumin 3.1 (L) 3.5 - 5.0 g/dL  ? AST 26 15 - 41 U/L  ? ALT 25 0 - 44 U/L  ? Alkaline Phosphatase 74 38 - 126 U/L  ? Total Bilirubin 0.6 0.3 - 1.2 mg/dL  ? GFR, Estimated >60 >60 mL/min  ? Anion gap 8 5 - 15  ?Lipase, blood     Status: None  ? Collection Time: 02/08/22 10:16 PM  ?Result Value Ref Range  ? Lipase 26 11 - 51 U/L  ? ?B/Positive/-- (01/12 1526) ? ?Imaging:  ? ?MAU Course/MDM: ?Orders Placed This  Encounter  ?  Procedures  ? Urinalysis, Routine w reflex microscopic Urine, Clean Catch  ? CBC  ? Comprehensive metabolic panel  ? Lipase, blood  ? Discharge patient  ?  ?Meds ordered this encounter  ?Medications  ? acetaminophen (TYLENOL) tablet 1,000 mg  ? ibuprofen (ADVIL) tablet 600 mg  ? ibuprofen (ADVIL) 600 MG tablet  ?  Sig: Take 1 tablet (600 mg total) by mouth every 6 (six) hours as needed.  ?  Dispense:  10 tablet  ?  Refill:  0  ?  Order Specific Question:   Supervising Provider  ?  Answer:   ERVIN, MICHAEL L [[id number]]  ? traMADol (ULTRAM) 50 MG tablet  ?  Sig: Take 1-2 tablets (50-100 mg total) by mouth every 6 (six) hours as needed.  ?  Dispense:  6 tablet  ?  Refill:  0  ?  Order Specific Question:   Supervising Provider  ?  Answer:   ERVIN, MICHAEL L [[id number]]  ?  ? ?NST reviewed and appropriate for gestational age ?No evidence of preterm labor with closed cervix ?U/A without bacteria or hematuria,and no CVA tenderness, so unlikely kidney related pain ?WBCs wnl and no acute abdomen or rebound tenderness so unlikely appendicitis ?Pain to palpation c/w MSK pain ?Tylenol 1000 mg PO given with some improvement in pain. Pain resolves when pt in bed, but occurs when she gets up to walk, go to bathroom, etc.   ?Discussed use of NSAIDs in second trimester of pregnancy with pt and ibuprofen 600 mg PO ordered ?Pt with some additional improvement in pain but reports it is still present she moves ?D/C home with Rx for rest, heat/ice, ibuprofen Q 6 hours x 2-3 days, and Rx for Tramadol x 6 tabs PRN ?Reviewed reasons to return to MAU ?Keep scheduled appt in 1 week ? ? ? ?Assessment: ?1. Musculoskeletal pain   ?2. Abdominal pain during pregnancy in second trimester   ?3. [redacted] weeks gestation of pregnancy   ? ? ?Plan: ?Discharge home ?Labor precautions and fetal kick counts ? Follow-up Information   ? ? [facility] Follow up.   ?Specialty: Obstetrics and Gynecology ?Why: As  scheduled ?Contact information: ?[address] ?[place] [address] ?[phone] ? ?  ?  ? ? [facility] Follow up.   ?Specialty: Obstetrics and Gynecology ?Why

## 2022-02-09 DIAGNOSIS — O26892 Other specified pregnancy related conditions, second trimester: Secondary | ICD-10-CM

## 2022-02-09 DIAGNOSIS — Z3A23 23 weeks gestation of pregnancy: Secondary | ICD-10-CM | POA: Diagnosis not present

## 2022-02-09 DIAGNOSIS — M7918 Myalgia, other site: Secondary | ICD-10-CM | POA: Diagnosis not present

## 2022-02-09 DIAGNOSIS — R109 Unspecified abdominal pain: Secondary | ICD-10-CM

## 2022-02-09 MED ORDER — TRAMADOL HCL 50 MG PO TABS: 50.0000 mg | ORAL_TABLET | Freq: Four times a day (QID) | ORAL | 0 refills | Status: DC | PRN

## 2022-02-09 MED ORDER — IBUPROFEN 600 MG PO TABS: 600.0000 mg | ORAL_TABLET | Freq: Four times a day (QID) | ORAL | 0 refills | Status: DC | PRN

## 2022-02-10 LAB — CULTURE, OB URINE

## 2022-02-10 NOTE — Telephone Encounter (Signed)
Patient was evaluated at MAU for these concerns. Called pt to follow up. Pt reports same level of pain. Currently taking prescribed pain meds and plans to follow up at [place] on 02/16/22 and then prenatal appt 02/28/22. Encouraged pt to call back on Monday with concerns or be seen at MAU over the weekend. ?

## 2022-02-16 ENCOUNTER — Ambulatory Visit: Payer: BC Managed Care – PPO | Attending: Maternal & Fetal Medicine

## 2022-02-16 ENCOUNTER — Ambulatory Visit: Payer: BC Managed Care – PPO | Admitting: *Deleted

## 2022-02-16 VITALS — BP 112/74 | HR 108

## 2022-02-16 DIAGNOSIS — Z3492 Encounter for supervision of normal pregnancy, unspecified, second trimester: Secondary | ICD-10-CM

## 2022-02-16 DIAGNOSIS — O285 Abnormal chromosomal and genetic finding on antenatal screening of mother: Secondary | ICD-10-CM

## 2022-02-16 DIAGNOSIS — Z3A24 24 weeks gestation of pregnancy: Secondary | ICD-10-CM | POA: Diagnosis not present

## 2022-02-16 DIAGNOSIS — O99891 Other specified diseases and conditions complicating pregnancy: Secondary | ICD-10-CM

## 2022-02-16 DIAGNOSIS — Z362 Encounter for other antenatal screening follow-up: Secondary | ICD-10-CM | POA: Diagnosis not present

## 2022-02-16 DIAGNOSIS — D563 Thalassemia minor: Secondary | ICD-10-CM | POA: Diagnosis not present

## 2022-02-16 DIAGNOSIS — Z6841 Body Mass Index (BMI) 40.0 and over, adult: Secondary | ICD-10-CM

## 2022-02-16 DIAGNOSIS — Z148 Genetic carrier of other disease: Secondary | ICD-10-CM | POA: Insufficient documentation

## 2022-02-16 DIAGNOSIS — E669 Obesity, unspecified: Secondary | ICD-10-CM

## 2022-02-16 DIAGNOSIS — O99212 Obesity complicating pregnancy, second trimester: Secondary | ICD-10-CM | POA: Insufficient documentation

## 2022-02-16 DIAGNOSIS — O99210 Obesity complicating pregnancy, unspecified trimester: Secondary | ICD-10-CM

## 2022-02-16 DIAGNOSIS — O2340 Unspecified infection of urinary tract in pregnancy, unspecified trimester: Secondary | ICD-10-CM

## 2022-02-17 ENCOUNTER — Other Ambulatory Visit: Payer: Self-pay | Admitting: *Deleted

## 2022-02-17 DIAGNOSIS — O99212 Obesity complicating pregnancy, second trimester: Secondary | ICD-10-CM

## 2022-02-23 ENCOUNTER — Encounter: Payer: BC Managed Care – PPO | Admitting: Student

## 2022-02-28 ENCOUNTER — Encounter: Payer: Self-pay | Admitting: Medical

## 2022-02-28 ENCOUNTER — Ambulatory Visit (INDEPENDENT_AMBULATORY_CARE_PROVIDER_SITE_OTHER): Payer: BC Managed Care – PPO | Admitting: Medical

## 2022-02-28 ENCOUNTER — Encounter: Payer: Self-pay | Admitting: Family Medicine

## 2022-02-28 VITALS — BP 130/79 | HR 108 | Wt 282.0 lb

## 2022-02-28 DIAGNOSIS — G4701 Insomnia due to medical condition: Secondary | ICD-10-CM

## 2022-02-28 DIAGNOSIS — D563 Thalassemia minor: Secondary | ICD-10-CM

## 2022-02-28 DIAGNOSIS — N949 Unspecified condition associated with female genital organs and menstrual cycle: Secondary | ICD-10-CM

## 2022-02-28 DIAGNOSIS — Z6841 Body Mass Index (BMI) 40.0 and over, adult: Secondary | ICD-10-CM

## 2022-02-28 DIAGNOSIS — Z3492 Encounter for supervision of normal pregnancy, unspecified, second trimester: Secondary | ICD-10-CM

## 2022-02-28 DIAGNOSIS — O99210 Obesity complicating pregnancy, unspecified trimester: Secondary | ICD-10-CM

## 2022-02-28 DIAGNOSIS — Z3A26 26 weeks gestation of pregnancy: Secondary | ICD-10-CM

## 2022-02-28 DIAGNOSIS — O2340 Unspecified infection of urinary tract in pregnancy, unspecified trimester: Secondary | ICD-10-CM

## 2022-02-28 DIAGNOSIS — Z148 Genetic carrier of other disease: Secondary | ICD-10-CM

## 2022-02-28 NOTE — Patient Instructions (Addendum)
To help you sleep:  ?Melatonin  ?Magnesium  ?Unisom  ?Benadryl  ?Tylenol PM ? ?AREA PEDIATRIC/FAMILY PRACTICE PHYSICIANS ? ?[place] ([id number]) ?[facility] ?Deirdre Priest, MD; Lum Babe, MD; [place] Slider, MD; Leveda Anna, MD; McDiarmid, MD; Jerene Bears, MD; Jennette Kettle, MD; Gwendolyn Grant, MD ?[address] [place] [place], [place] [address] ?[phone] ?Mon-Fri 8:30-12:30, 1:30-5:00 ?Providers come to see babies at [facility] ?Accepting Medicaid ?[facility] ?Limited providers who accept newborns: Docia Chuck, MD; Kateri Plummer, MD; Paulino Rily, MD ?[address], [place], [place] [address] ?[phone] ?Mon-Fri 8:00-5:30 ?Babies seen by providers at [facility] ?Does NOT accept Medicaid ?Please call early in hospitalization for appointment (limited availability)  ?[employer] ?Delrae Alfred, MD ?[address] [place], [place] [address] ?[phone] ?Mon, Tue, Thur, Fri 8:30-5:00, Wed 10:00-7:00 (closed 1-2pm) ?Babies seen by [facility] providers ?Accepting Medicaid ?Donnie Coffin - Pediatrician ?Donnie Coffin, MD ?[address], [place], [place] [address] ?([phone] ?Mon-Fri 8:30-5:00, Sat 8:30-12:00 ?Provider comes to see babies at [facility] ?Accepting Medicaid ?Must have been referred from current patients or contacted office prior to delivery ?[facility]) ?Manson Passey, MD; Ave Filter, MD; Luna Fuse, MD; Kennedy Bucker, MD; Konrad Dolores, MD; Kathlene November, MD; Jenne Campus, MD; Lubertha South, MD; Wynetta Emery, MD; Duffy Rhody, MD; Gerre Couch, NP; Shirl Harris, NP ?[address] [place], [place], [place] [address] ?[phone] ?Mon, Tue, Thur, Fri 8:30-5:30, Wed 9:30-5:30, Sat 8:30-12:30 ?Babies seen by [facility] providers ?Accepting Medicaid ?Only accepting infants of first-time parents or siblings of current patients ?Hospital discharge coordinator will make follow-up appointment ?Cyril Mourning ?[address],  [place], [place]  [address] ?[phone]   Fax - [phone] ?[facility] ?[address], [place], [place]  [address] ?Phone - [phone]   Fax - [phone] ?Shilpa Gosrani ?[address] [place], [place]  [address] ?[phone] ? ?[place] ([id number]) ?[facility] ?Jenne Pane, MD; Alita Chyle, MD; Princella Ion, MD; MD; Earlene Plater, MD; Jamesetta Orleans, MD; Alvera Novel, MD; Clarene Duke, MD; Rana Snare, MD; Carmon Ginsberg, MD; Alinda Money, MD; Hosie Poisson, MD; Mayford Knife, MD ?[address] [place], [place] [address] ?([phone] ?Mon-Fri 8:30-5:00 (extended evenings Mon-Thur as needed), Sat-Sun 10:00-1:00 ?Providers come to see babies at [facility] ?Accepting Medicaid for families of first-time babies and families with all children in the household age 55 and under. Must register with office prior to making appointment (M-F only). ?[facility] ?Suezanne Jacquet, NP; Lynelle Doctor, MD; Susann Givens, MD; [place], [place] ?[address] [place], [place] [address] ?[phone] ?Mon-Fri 8:00-5:00 ?Babies seen by providers at [facility] ?Does NOT accept Medicaid/Commercial Insurance Only ?[facility] at [facility])  ?Holly Bodily, MD; Zachery Dauer, MD; Stefan Church, MD; Sabino Dick, MD; Quitman Livings, MD; Farris Has, MD; Gaynell Face, MD; Betha Loa, MD; Colon Flattery, MD; Clifton James, MD ?[address] [place], [place] [address] ?([phone] ?Mon-Fri 8:30-5:30, Sat (Oct.-Mar.) 9:00-1:00 ?Babies seen by providers at [facility] ?Accepting Medicaid ? ?[place] ([id number]) ?[facility] ?Azucena Kuba, MD; Sheliah Hatch, MD ?[address], [place], [place] [address] ?[phone] ?Mon-Fri 8:30-5:00, Sat 8:30-12:00 ?Providers come to see babies at [facility] ?Does NOT accept Medicaid ?[facility] ?Donalee Citrin, [place]; [place], MD; [place], [place]; Wynelle Link, MD; Azucena Cecil, MD ?[address] [place], [place] [address] ?[phone] ?Mon-Fri 8:00-5:00 ?Babies seen by providers at  [facility] ?Does NOT accept Medicaid ?Only accepting babies of parents who are patients ?Please call early in hospitalization for appointment (limited availability) ?[facility] ?Chestine Spore, MD; Abran Cantor, MD; Early Osmond, MD; Cherre Huger, NP; Hyacinth Meeker,  MD; Dwan Bolt, MD; Jarold Motto, NP; Dario Guardian, MD; Talmage Nap, MD; Maisie Fus, MD; Pricilla Holm, MD; Tama High, MD ?[address], [place], [place] [address] ?[phone] ?Mon-Fri 8:00-5:00, Sat 9:00-12:00 ?Providers come to see babies at [facility] ?Does NOT accept Medicaid ? ?[facility] ([id number]) ?[facility] ?Limited providers accepting new patients: Drema Pry, NP; [place], [place] ?[address] [place], [place] [address] ?[phone] ?Mon-Fri 8:00-5:00 ?Babies seen by providers at [facility] ?Does NOT accept Medicaid ?Only accepting babies of parents who are patients ?Please call early in hospitalization for appointment (limited availability) ?[facility] ?Cardell Peach, MD; Nash Dimmer, MD ?[address] [place] [place], [place] [address] ?[phone] (press [phone] to schedule appointment) ?Mon-Fri 8:00-5:00 ?Providers come to see babies at [facility] ?Does NOT accept Medicaid ?[facility] ?Mazer, MD ?[address] [place], [place] [address] ?[phone] ?Mon-Fri 8:30-5:00 (lunch 12:30-1:00), extended hours by appointment only Wed 5:00-6:30 ?Babies seen by [facility] providers ?Accepting Medicaid ?Nature conservation officer at [place] ?Salomon Fick, MD; [place], MD; Hassan Rowan, MD ?[address] [place], [place], [place] [address] ?([phone] ?Mon-Fri 8:00-5:00 ?Babies seen by [facility] providers ?Does NOT accept Medicaid ?Nature conservation officer at [employer] ?Jimmey Ralph, MD; Durene Cal, MD; [place], DO ?[address] [place] [address] [place], [place] [address] ?[phone] ?Mon-Fri 8:00-5:00 ?Babies seen by [facility] providers ?Does NOT accept Medicaid ?[facility] ?Apolinar Junes, [place]; [place], [place]; [place], NP; Avis Epley, MD; Vonna Kotyk, MD;  Clance Boll, MD; Stevphen Rochester, NP; Arvilla Market, NP; Ann Maki, NP; Otis Dials, NP; Vaughan Basta, MD; Eartha Inch, MD ?[address] [place], [place] [address] ?[phone] ?Mon-Fri 8:30-5:00, Sat 10:00-1:00 ?Providers come to see babies at [facility] ?Does NOT accept Medicaid ?Free prenatal information session Tuesdays at 4:45pm ?[facility] ?Everlene Other, MD; Roger Shel[place], [place]; [place], [place]; [place], [place] ?[address] [place] [address] ?[phone] ?Mon-Fri 7:30-5:30 ?Babies seen by [facility] providers ?[facility] ?[address], [place], [place]  [address] ?[phone]   Fax - [phone] ? ?[place] ([phone] & [phone]) ?[facility] ?Pecola Leisure, MD ?[address] [place], [place] [address] ?[phone] ?Mon-Thur 8:00-6:00 ?Providers come to see babies at [facility] ?Accepting Medicaid ?[facility] ?Dareen Piano, NP; Cyndia Bent, MD; [place], [place]; [place], [place] ?[address] [place], [place] [address] ?[phone] ?Mon-Thur 7:30-7:30, Fri 7:30-4:30 ?Babies seen by [facility] providers ?Accepting Medicaid ?[facility] ?Juanito Doom, MD; Janene Harvey, NP; Vonita Moss, MD ?[address], [place], [place] [address] ?[phone] ?Mon-Fri 8:30-5:00, Sat 8:30-12:00 ?Providers come to see babies at [facility] ?Accepting Medicaid ?Must have ?Meet & Greet? appointment at office prior to delivery ?[facility]) ?Marlow Baars, MD; Earlene Plater, MD; Lucretia Roers, MD ?[address], [place], [place] [address] ?[phone] ?Mon-Wed 8:00-6:00, Thur-Fri 8:00-5:00, Sat 9:00-12:00 ?Providers come to see babies at [facility] ?Does NOT accept Medicaid ?Only accepting siblings of current patients ?[facility]  ?[address], [place], [place]  [address] ?[phone]   Fax - [phone] ?[facility] at [facility] ?[address],  [place], [place]  [address] ?[phone]   Fax - [phone] ? ?[place]  ([phone] & [phone]) ?Nature conservation officer at [employer] ?Lenor Coffin, DO ?[address]

## 2022-02-28 NOTE — Progress Notes (Signed)
? ?  PRENATAL VISIT NOTE ? ?Subjective:  ?Renee Lester is a 26 y.o. G2P0010 at [redacted]w[redacted]d being seen today for ongoing prenatal care.  She is currently monitored for the following issues for this high-risk pregnancy and has Polycystic ovary syndrome; Class 3 severe obesity due to excess calories without serious comorbidity with body mass index (BMI) of 40.0 to 44.9 in adult [facility]); Supervision of low-risk pregnancy; Obesity in pregnancy; UTI in pregnancy, antepartum; Alpha thalassemia silent carrier; Carrier of spinal muscular atrophy; and Asymptomatic bacteriuria during pregnancy in second trimester on their problem list. ? ?Patient reports fatigue and insomnia and RLQ pain.  Contractions: Not present. Vag. Bleeding: None.  Movement: Present. Denies leaking of fluid.  ? ?The following portions of the patient's history were reviewed and updated as appropriate: allergies, current medications, past family history, past medical history, past social history, past surgical history and problem list.  ? ?Objective:  ? ?Vitals:  ? 02/28/22 1535  ?BP: 130/79  ?Pulse: (!) 108  ?Weight: 282 lb (127.9 kg)  ? ? ?Fetal Status: Fetal Heart Rate (bpm): 139   Movement: Present    ? ?General:  Alert, oriented and cooperative. Patient is in no acute distress.  ?Skin: Skin is warm and dry. No rash noted.   ?Cardiovascular: Normal heart rate noted  ?Respiratory: Normal respiratory effort, no problems with respiration noted  ?Abdomen: Soft, gravid, appropriate for gestational age.  Pain/Pressure: Present     ?Pelvic: Cervical exam deferred        ?Extremities: Normal range of motion.  Edema: None  ?Mental Status: Normal mood and affect. Normal behavior. Normal judgment and thought content.  ? ?Assessment and Plan:  ?Pregnancy: G2P0010 at [redacted]w[redacted]d ?1. Encounter for supervision of low-risk pregnancy in second trimester ?- Anticipatory guidance for 2 hour fasting GTT, CBC, HIV, RPR and TDAP at next visit ?- Peds list given  ? ?2. Alpha thalassemia  silent carrier ?- Has partner kit  ? ?3. Carrier of spinal muscular atrophy ?- Has partner kit ? ?4. Obesity in pregnancy ?- Growth [place] scheduled 03/14/22 ? ?5. Class 3 severe obesity due to excess calories without serious comorbidity with body mass index (BMI) of 40.0 to 44.9 in adult [facility]) ? ?6. UTI in pregnancy, antepartum ? ?7. Round ligament pain ?- Discussed pregnancy belt and hydrotherapy for pain  ? ?8. Insomnia due to medical condition ?- Gave list of OTC approved medications for sleep in pregnancy to trial  ? ?9. [redacted] weeks gestation of pregnancy ? ?Preterm labor symptoms and general obstetric precautions including but not limited to vaginal bleeding, contractions, leaking of fluid and fetal movement were reviewed in detail with the patient. ?Please refer to After Visit Summary for other counseling recommendations.  ? ?Return in about 2 weeks (around 03/14/2022) for LOB, 28 week labs (fasting), In-Person, any provider. ? ?Future Appointments  ?Date Time Provider   ?03/17/2022  9:30 AM [facility] LAB [facility]  ?03/17/2022 10:55 AM Hillard Danker Myles Rosenthal, PA-C [facility]  ?03/17/2022  3:30 PM [facility] NURSE [facility]  ?03/17/2022  3:45 PM [facility] US6 [facility]  ? ? ?Kerry Hough, PA-C ? ?

## 2022-03-03 ENCOUNTER — Encounter: Payer: Self-pay | Admitting: *Deleted

## 2022-03-06 ENCOUNTER — Other Ambulatory Visit (HOSPITAL_COMMUNITY)
Admission: RE | Admit: 2022-03-06 | Discharge: 2022-03-06 | Disposition: A | Discharge status: Home / Self Care | Payer: BC Managed Care – PPO | Source: Ambulatory Visit | Attending: Family Medicine | Admitting: Family Medicine

## 2022-03-06 ENCOUNTER — Other Ambulatory Visit: Payer: Self-pay | Admitting: Family Medicine

## 2022-03-06 ENCOUNTER — Ambulatory Visit (INDEPENDENT_AMBULATORY_CARE_PROVIDER_SITE_OTHER): Payer: BC Managed Care – PPO | Admitting: Family Medicine

## 2022-03-06 ENCOUNTER — Encounter: Payer: Self-pay | Admitting: Family Medicine

## 2022-03-06 VITALS — BP 110/64 | Wt 284.0 lb

## 2022-03-06 DIAGNOSIS — R350 Frequency of micturition: Secondary | ICD-10-CM | POA: Diagnosis not present

## 2022-03-06 DIAGNOSIS — Z113 Encounter for screening for infections with a predominantly sexual mode of transmission: Secondary | ICD-10-CM | POA: Diagnosis present

## 2022-03-06 DIAGNOSIS — N760 Acute vaginitis: Secondary | ICD-10-CM | POA: Diagnosis not present

## 2022-03-06 DIAGNOSIS — N898 Other specified noninflammatory disorders of vagina: Secondary | ICD-10-CM

## 2022-03-06 DIAGNOSIS — Z3490 Encounter for supervision of normal pregnancy, unspecified, unspecified trimester: Secondary | ICD-10-CM

## 2022-03-06 DIAGNOSIS — B9689 Other specified bacterial agents as the cause of diseases classified elsewhere: Secondary | ICD-10-CM

## 2022-03-06 LAB — POCT WET PREP (WET MOUNT)
Clue Cells Wet Prep Whiff POC: POSITIVE
Trichomonas Wet Prep HPF POC: ABSENT

## 2022-03-06 LAB — POCT URINALYSIS DIP (MANUAL ENTRY)
Bilirubin, UA: NEGATIVE
Blood, UA: NEGATIVE
Glucose, UA: NEGATIVE mg/dL
Ketones, POC UA: NEGATIVE mg/dL
Leukocytes, UA: NEGATIVE
Nitrite, UA: NEGATIVE
Protein Ur, POC: NEGATIVE mg/dL
Spec Grav, UA: 1.015 (ref 1.010–1.025)
Urobilinogen, UA: 1 E.U./dL
pH, UA: 7.5 (ref 5.0–8.0)

## 2022-03-06 MED ORDER — METRONIDAZOLE 500 MG PO TABS: 500.0000 mg | ORAL_TABLET | Freq: Two times a day (BID) | ORAL | 0 refills | Status: DC

## 2022-03-06 MED ORDER — ONDANSETRON 4 MG PO TBDP: 4.0000 mg | ORAL_TABLET | Freq: Four times a day (QID) | ORAL | 0 refills | Status: DC | PRN

## 2022-03-06 NOTE — Assessment & Plan Note (Signed)
Patient with copious vaginal discharge, wet prep + clue cells, + whiff test. Currently 27w pregnant. Will treat with flagyl 500 mg BID x 7d. GC pending.  ?

## 2022-03-06 NOTE — Assessment & Plan Note (Signed)
2nd trimester pregnancy with urinary frequency, denies urgency and dysuria. UA with neg leuks and nitrites. Patient has had +UTI in pregnancy earlier this trimester. Will send OB urine cx. ?

## 2022-03-06 NOTE — Progress Notes (Signed)
? ? ?  SUBJECTIVE:  ? ?CHIEF COMPLAINT / HPI: urinary frequency ? ?Urinary frequency: ?Patient is a 26 y.o. female presenting with urinary frequency for 3-4 days. She denies dysuria, urgency or vaginal discharge. Patient reports she has had "multiple urine infections" since being pregnant. On chart review she had "multiple species pregnant, suggest recollection" result on 02/08/22; 01/05/22 she had a urine culture with no growth; on 12/01/21 she had e coli resistant to bactrim >100K CFU's on urine culture; she had urine culture with normal flora on 10/20/21; + strep >100K CFU's no GBS on 10/07/21. She was treated with cefadroxil for e coli UTI in January. She had BV and treated in January 2023, + chlamydia s/p azithromycin treatment in November 2022. She is interested in screening for sexually transmitted infections today, but will wait for her next third trimester appt for HIV and RPR. Patient is seen at [facility] for prenatal care.  ? ?PERTINENT  PMH / PSH: [redacted]w[redacted]d pregnancy ? ?OBJECTIVE:  ? ?BP 110/64   Wt 284 lb (128.8 kg)   LMP 08/28/2021   BMI 43.18 kg/m?   ? ?General: NAD, pleasant, able to participate in exam ?Respiratory: Normal effort, no obvious respiratory distress ?Pelvic: VULVA: normal appearing vulva with no masses, tenderness or lesions, VAGINA: Normal appearing vagina with normal color, no lesions, with copious, white, and thick discharge present CERVIX: No lesions, copious, white, and thick discharge present ? ?ASSESSMENT/PLAN:  ? ?BV (bacterial vaginosis) ?Patient with copious vaginal discharge, wet prep + clue cells, + whiff test. Currently 27w pregnant. Will treat with flagyl 500 mg BID x 7d. GC pending.  ? ?Urinary frequency ?2nd trimester pregnancy with urinary frequency, denies urgency and dysuria. UA with neg leuks and nitrites. Patient has had +UTI in pregnancy earlier this trimester. Will send OB urine cx. ?  ?Gladys Damme, MD ?[place]  ? ?

## 2022-03-06 NOTE — Patient Instructions (Addendum)
It was a pleasure to see you today! ? ?We will get some labs today.  If they are abnormal or we need to do something about them, I will call you.  If they are normal, I will send you a message on MyChart (if it is active) or a letter in the mail.  If you don't hear from [place] in 2 weeks, please call the office  [phone]. ?For BV: treat with flagyl 500 mg twice a day for 7 days. If you have trouble taking this medication due to nausea or vomiting, please let us know by calling [phone]. ? ? ?Be Well, ? ?Dr. Chauncey Reading ? ?

## 2022-03-08 LAB — CERVICOVAGINAL ANCILLARY ONLY
Chlamydia: NEGATIVE
Comment: NEGATIVE
Comment: NORMAL
Neisseria Gonorrhea: NEGATIVE

## 2022-03-08 LAB — CULTURE, OB URINE

## 2022-03-08 LAB — URINE CULTURE, OB REFLEX

## 2022-03-13 ENCOUNTER — Telehealth: Payer: Self-pay | Admitting: General Practice

## 2022-03-13 ENCOUNTER — Other Ambulatory Visit: Payer: Self-pay

## 2022-03-13 DIAGNOSIS — Z3492 Encounter for supervision of normal pregnancy, unspecified, second trimester: Secondary | ICD-10-CM

## 2022-03-13 NOTE — Telephone Encounter (Signed)
Patient called into front office requesting a call back from a nurse as she needs assistance getting a handicap tag.  ? ?Called patient, no answer- unable to leave message. ?

## 2022-03-14 ENCOUNTER — Ambulatory Visit: Payer: BC Managed Care – PPO

## 2022-03-17 ENCOUNTER — Encounter: Payer: Self-pay | Admitting: Medical

## 2022-03-17 ENCOUNTER — Other Ambulatory Visit: Payer: BC Managed Care – PPO

## 2022-03-17 ENCOUNTER — Ambulatory Visit: Payer: BC Managed Care – PPO | Attending: Obstetrics and Gynecology | Admitting: *Deleted

## 2022-03-17 ENCOUNTER — Ambulatory Visit (HOSPITAL_BASED_OUTPATIENT_CLINIC_OR_DEPARTMENT_OTHER): Payer: BC Managed Care – PPO

## 2022-03-17 VITALS — BP 116/73 | HR 101

## 2022-03-17 DIAGNOSIS — O2340 Unspecified infection of urinary tract in pregnancy, unspecified trimester: Secondary | ICD-10-CM

## 2022-03-17 DIAGNOSIS — E669 Obesity, unspecified: Secondary | ICD-10-CM

## 2022-03-17 DIAGNOSIS — Z148 Genetic carrier of other disease: Secondary | ICD-10-CM

## 2022-03-17 DIAGNOSIS — D563 Thalassemia minor: Secondary | ICD-10-CM

## 2022-03-17 DIAGNOSIS — O99212 Obesity complicating pregnancy, second trimester: Secondary | ICD-10-CM | POA: Diagnosis not present

## 2022-03-17 DIAGNOSIS — O99013 Anemia complicating pregnancy, third trimester: Secondary | ICD-10-CM | POA: Insufficient documentation

## 2022-03-17 DIAGNOSIS — O285 Abnormal chromosomal and genetic finding on antenatal screening of mother: Secondary | ICD-10-CM

## 2022-03-17 DIAGNOSIS — Z3A28 28 weeks gestation of pregnancy: Secondary | ICD-10-CM | POA: Insufficient documentation

## 2022-03-17 DIAGNOSIS — D569 Thalassemia, unspecified: Secondary | ICD-10-CM | POA: Diagnosis not present

## 2022-03-17 DIAGNOSIS — O99213 Obesity complicating pregnancy, third trimester: Secondary | ICD-10-CM | POA: Insufficient documentation

## 2022-03-17 DIAGNOSIS — Z3493 Encounter for supervision of normal pregnancy, unspecified, third trimester: Secondary | ICD-10-CM

## 2022-03-17 DIAGNOSIS — R8271 Bacteriuria: Secondary | ICD-10-CM

## 2022-03-17 DIAGNOSIS — O99210 Obesity complicating pregnancy, unspecified trimester: Secondary | ICD-10-CM

## 2022-03-20 ENCOUNTER — Ambulatory Visit (INDEPENDENT_AMBULATORY_CARE_PROVIDER_SITE_OTHER): Payer: BC Managed Care – PPO

## 2022-03-20 ENCOUNTER — Other Ambulatory Visit: Payer: Self-pay | Admitting: *Deleted

## 2022-03-20 ENCOUNTER — Other Ambulatory Visit: Payer: BC Managed Care – PPO

## 2022-03-20 ENCOUNTER — Telehealth: Payer: Self-pay

## 2022-03-20 ENCOUNTER — Telehealth: Payer: Self-pay | Admitting: Family Medicine

## 2022-03-20 VITALS — BP 114/84 | HR 116 | Wt 286.0 lb

## 2022-03-20 DIAGNOSIS — D563 Thalassemia minor: Secondary | ICD-10-CM

## 2022-03-20 DIAGNOSIS — Z3492 Encounter for supervision of normal pregnancy, unspecified, second trimester: Secondary | ICD-10-CM

## 2022-03-20 DIAGNOSIS — Z331 Pregnant state, incidental: Secondary | ICD-10-CM | POA: Diagnosis not present

## 2022-03-20 DIAGNOSIS — R102 Pelvic and perineal pain: Secondary | ICD-10-CM

## 2022-03-20 DIAGNOSIS — Z3A28 28 weeks gestation of pregnancy: Secondary | ICD-10-CM | POA: Diagnosis not present

## 2022-03-20 DIAGNOSIS — Z23 Encounter for immunization: Secondary | ICD-10-CM

## 2022-03-20 DIAGNOSIS — Z3493 Encounter for supervision of normal pregnancy, unspecified, third trimester: Secondary | ICD-10-CM

## 2022-03-20 DIAGNOSIS — Z6841 Body Mass Index (BMI) 40.0 and over, adult: Secondary | ICD-10-CM

## 2022-03-20 DIAGNOSIS — O26893 Other specified pregnancy related conditions, third trimester: Secondary | ICD-10-CM

## 2022-03-20 NOTE — Progress Notes (Signed)
? ?  PRENATAL VISIT NOTE ? ?Subjective:  ?Renee Lester is a 26 y.o. G2P0010 at [redacted]w[redacted]d being seen today for ongoing prenatal care.  She is currently monitored for the following issues for this low-risk pregnancy and has Urinary frequency; Polycystic ovary syndrome; Class 3 severe obesity due to excess calories without serious comorbidity with body mass index (BMI) of 40.0 to 44.9 in adult [facility]); BV (bacterial vaginosis); Supervision of low-risk pregnancy; Obesity in pregnancy; UTI in pregnancy, antepartum; Alpha thalassemia silent carrier; Carrier of spinal muscular atrophy; and Asymptomatic bacteriuria during pregnancy in second trimester on their problem list. ? ?Patient reports ongoing pelvic pain.  Contractions: Not present. Vag. Bleeding: None.  Movement: Present. Denies leaking of fluid.  ? ?The following portions of the patient's history were reviewed and updated as appropriate: allergies, current medications, past family history, past medical history, past social history, past surgical history and problem list.  ? ?Objective:  ? ?Vitals:  ? 03/20/22 0941  ?BP: 114/84  ?Pulse: (!) 116  ?Weight: 286 lb (129.7 kg)  ? ? ?Fetal Status: Fetal Heart Rate (bpm): 143 Fundal Height: 29 cm Movement: Present    ? ?General:  Alert, oriented and cooperative. Patient is in no acute distress.  ?Skin: Skin is warm and dry. No rash noted.   ?Cardiovascular: Normal heart rate noted  ?Respiratory: Normal respiratory effort, no problems with respiration noted  ?Abdomen: Soft, gravid, appropriate for gestational age.  Pain/Pressure: Present     ?Pelvic: Cervical exam deferred        ?Extremities: Normal range of motion.  Edema: None  ?Mental Status: Normal mood and affect. Normal behavior. Normal judgment and thought content.  ? ?Assessment and Plan:  ?Pregnancy: G2P0010 at [redacted]w[redacted]d ?1. [redacted] weeks gestation of pregnancy ?- Endorses active fetal movement ?- FH appropriate  ? ?- Tdap vaccine greater than or equal to 7yo IM ? ?2.  Encounter for supervision of low-risk pregnancy in third trimester ?- Routine OB ?- GTT and labs today ? ?3. Pelvic pain affecting pregnancy in third trimester, antepartum ?- Ongoing pelvic pain. Pain is exacerbated with walking. Denies contractions, bleeding or leaking fluid. She is requesting we fill out DMV form so that she may have a handicap sticker. She reports that she uses her support belt, although does not use every day. She alternates wearing the support belt and using heat. She has also tried taking Tylenol but has not taken recently.  ?- I encouraged patient to continue wearing support belt, but should do so on a daily basis. I encouraged patient to look into chiropractor or could consider pelvic floor PT.  ?- Reassured patient on normalcy of pelvic pain in pregnancy  ? ? ?Preterm labor symptoms and general obstetric precautions including but not limited to vaginal bleeding, contractions, leaking of fluid and fetal movement were reviewed in detail with the patient. ?Please refer to After Visit Summary for other counseling recommendations.  ? ?Return in about 2 weeks (around 04/03/2022). ? ?Future Appointments  ?Date Time Provider Department Center  ?04/14/2022  2:15 PM [facility] NURSE [facility]  ?04/14/2022  2:30 PM [facility] US3 [facility]  ?04/21/2022  2:15 PM [facility] NURSE [facility]  ?04/21/2022  2:30 PM [facility] US3 [facility]  ?04/27/2022  2:30 PM [facility] NURSE [facility]  ?04/27/2022  2:45 PM [facility] US5 [facility]  ?05/05/2022  2:30 PM [facility] NURSE [facility]  ?05/05/2022  2:45 PM [facility] US5 [facility]  ? ? ?Brand Males, CNM ? ?

## 2022-03-20 NOTE — Telephone Encounter (Signed)
Patient was checking out from her appointment on 5/1 and did not know when she could come back for her appointment due to her work scheduled. Patient stated she would call back to make the appointment after I emphasized how important this appointment is.  ?

## 2022-03-20 NOTE — Telephone Encounter (Signed)
Patient calls nurse line requesting handicap placard to finish out pregnancy.  ? ?Patient was seen for routine OB at [facility], however they declined to fill out form. Patient reports they referred her to chiropractor, however she can not afford their services.  ? ?Patient reports she has "pretty bad" pelvic pain that is exacerbated when she walks. ? ?Will forward to PCP if he would be willing to sign off on handicap form.   ?

## 2022-03-20 NOTE — Progress Notes (Signed)
Patient stated that she will need a provider to sign off on documents in order for her to receive her handicap sticker  ? ? ?Tdap vaccine explain and administered into left deltoid without any complications. Patient had no further questions or concerns. ? ?Haiden Clucas, CMA  ? ?03/20/22 ?

## 2022-03-21 LAB — CBC
Hematocrit: 34.8 % (ref 34.0–46.6)
Hemoglobin: 11.9 g/dL (ref 11.1–15.9)
MCH: 27.7 pg (ref 26.6–33.0)
MCHC: 34.2 g/dL (ref 31.5–35.7)
MCV: 81 fL (ref 79–97)
Platelets: 295 x10E3/uL (ref 150–450)
RBC: 4.3 x10E6/uL (ref 3.77–5.28)
RDW: 12.6 % (ref 11.7–15.4)
WBC: 7.9 x10E3/uL (ref 3.4–10.8)

## 2022-03-21 LAB — GLUCOSE TOLERANCE, 2 HOURS W/ 1HR
Glucose, 1 hour: 133 mg/dL (ref 70–179)
Glucose, 2 hour: 99 mg/dL (ref 70–152)
Glucose, Fasting: 82 mg/dL (ref 70–91)

## 2022-03-21 LAB — SYPHILIS: RPR W/REFLEX TO RPR TITER AND TREPONEMAL ANTIBODIES, TRADITIONAL SCREENING AND DIAGNOSIS ALGORITHM: RPR Ser Ql: NONREACTIVE

## 2022-03-21 LAB — HIV ANTIBODY (ROUTINE TESTING W REFLEX): HIV Screen 4th Generation wRfx: NONREACTIVE

## 2022-03-21 NOTE — Telephone Encounter (Signed)
Please let patient know that the decision to fill out a handicap placard should be left to OB/GYN who are treating her for this condition and I unfortunately will not be filling this out for her. Thank you

## 2022-03-22 ENCOUNTER — Ambulatory Visit (INDEPENDENT_AMBULATORY_CARE_PROVIDER_SITE_OTHER): Payer: BC Managed Care – PPO | Admitting: Student

## 2022-03-22 ENCOUNTER — Other Ambulatory Visit: Payer: Self-pay

## 2022-03-22 VITALS — BP 123/84 | HR 105 | Wt 285.4 lb

## 2022-03-22 DIAGNOSIS — H6121 Impacted cerumen, right ear: Secondary | ICD-10-CM | POA: Diagnosis not present

## 2022-03-22 DIAGNOSIS — H6120 Impacted cerumen, unspecified ear: Secondary | ICD-10-CM | POA: Insufficient documentation

## 2022-03-22 NOTE — Telephone Encounter (Signed)
1st attempt to reach patient to inform of doctors note. No answer. Unable to LVM none is set up at this time. Will try again  Aquilla Solian, CMA ? ?

## 2022-03-22 NOTE — Assessment & Plan Note (Addendum)
Bilateral cerumen impaction however only currently having diminished hearing in right ear.  Otherwise asymptomatic.  Debrox 5 drops twice daily x7 days and return for follow-up for irrigation and reassessment. ?

## 2022-03-22 NOTE — Patient Instructions (Signed)
It was great to see you today! Thank you for choosing [facility] for your primary care. Renee Lester was seen for difficulty hearing out of right ear. ? ?Today we addressed: ?You have earwax buildup in both ears.  Please use Debrox 5 drops in each ear twice a day and come back in 1 week for reevaluation. ? ?You should return to our clinic Return in about 1 week (around 03/29/2022) for Cerumen impaction bilaterally.. ? ?Please arrive 15 minutes before your appointment to ensure smooth check in process.  We appreciate your efforts in making this happen. ? ?Take care and seek immediate care sooner if you develop any concerns.  ? ?Thank you for allowing me to participate in your care, ?Shelby Mattocks, DO ?03/22/2022, 4:01 PM ?PGY-1, [facility] ?  ?

## 2022-03-22 NOTE — Progress Notes (Signed)
  SUBJECTIVE:   CHIEF COMPLAINT / HPI:   Patient presents with diminished hearing of the right ear for the last 3 days.  She tried Debrox 5 drops twice 3 days ago.  Denies ear pain, dizziness, drainage or bleeding, rashes.  Denies difficulty hearing out of left ear.  She recalls having to have her ear flushed years ago.  Denies being sick recently.  PERTINENT  PMH / PSH:   OBJECTIVE:  BP 123/84   Pulse (!) 105   Wt 285 lb 6.4 oz (129.5 kg)   LMP 08/28/2021   BMI 43.39 kg/m   General: NAD, pleasant, able to participate in exam HEENT: Bilateral cerumen impaction Cardiac: RRR, no murmurs auscultated. Respiratory: CTAB, normal effort, no wheezes, rales or rhonchi  ASSESSMENT/PLAN:  Hearing loss due to cerumen impaction Bilateral cerumen impaction however only currently having diminished hearing in right ear.  Otherwise asymptomatic.  Debrox 5 drops twice daily x7 days and return for follow-up for irrigation and reassessment.  Return in about 1 week (around 03/29/2022) for Cerumen impaction bilaterally. Shelby Mattocks, DO 03/22/2022, 6:03 PM PGY-1, [facility]

## 2022-03-23 NOTE — Telephone Encounter (Signed)
Patient called back, I told patient message from Dr. Wynelle Link, that he was not going to be able to complete the form that it is needing to be done by the OBGYN, patient said okay.  ? ?Thanks! ?

## 2022-03-23 NOTE — Telephone Encounter (Signed)
2nd attempt to reach patient. No answer. Unable to LVM phone just rang. Aquilla Solian, CMA ? ?

## 2022-03-28 ENCOUNTER — Other Ambulatory Visit: Payer: Self-pay | Admitting: Family Medicine

## 2022-03-28 DIAGNOSIS — Z3490 Encounter for supervision of normal pregnancy, unspecified, unspecified trimester: Secondary | ICD-10-CM

## 2022-03-28 DIAGNOSIS — O219 Vomiting of pregnancy, unspecified: Secondary | ICD-10-CM

## 2022-03-29 MED ORDER — PANTOPRAZOLE SODIUM 40 MG PO TBEC: 40.0000 mg | DELAYED_RELEASE_TABLET | Freq: Every day | ORAL | 1 refills | Status: DC

## 2022-03-29 MED ORDER — ONDANSETRON 4 MG PO TBDP: 4.0000 mg | ORAL_TABLET | Freq: Four times a day (QID) | ORAL | 0 refills | Status: DC | PRN

## 2022-04-06 ENCOUNTER — Ambulatory Visit (INDEPENDENT_AMBULATORY_CARE_PROVIDER_SITE_OTHER): Payer: BC Managed Care – PPO | Admitting: Family Medicine

## 2022-04-06 VITALS — BP 138/80 | HR 123 | Wt 287.3 lb

## 2022-04-06 DIAGNOSIS — Z0289 Encounter for other administrative examinations: Secondary | ICD-10-CM

## 2022-04-06 DIAGNOSIS — O99210 Obesity complicating pregnancy, unspecified trimester: Secondary | ICD-10-CM

## 2022-04-06 DIAGNOSIS — O2340 Unspecified infection of urinary tract in pregnancy, unspecified trimester: Secondary | ICD-10-CM

## 2022-04-06 DIAGNOSIS — O0990 Supervision of high risk pregnancy, unspecified, unspecified trimester: Secondary | ICD-10-CM

## 2022-04-06 NOTE — Progress Notes (Signed)
   PRENATAL VISIT NOTE  Subjective:  Renee Lester is a 26 y.o. G2P0010 at [redacted]w[redacted]d being seen today for ongoing prenatal care.  She is currently monitored for the following issues for this high-risk pregnancy and has Urinary frequency; Polycystic ovary syndrome; Class 3 severe obesity due to excess calories without serious comorbidity with body mass index (BMI) of 40.0 to 44.9 in adult [facility]); BV (bacterial vaginosis); Supervision of high risk pregnancy, antepartum; Obesity in pregnancy; UTI in pregnancy, antepartum; Alpha thalassemia silent carrier; Carrier of spinal muscular atrophy; Asymptomatic bacteriuria during pregnancy in second trimester; and Hearing loss due to cerumen impaction on their problem list.  Patient reports no complaints.  Contractions: Not present. Vag. Bleeding: None.  Movement: Present. Denies leaking of fluid.   The following portions of the patient's history were reviewed and updated as appropriate: allergies, current medications, past family history, past medical history, past social history, past surgical history and problem list.   Objective:   Vitals:   04/06/22 1624  BP: 138/80  Pulse: (!) 123  Weight: 287 lb 4.8 oz (130.3 kg)    Fetal Status: Fetal Heart Rate (bpm): 141   Movement: Present     General:  Alert, oriented and cooperative. Patient is in no acute distress.  Skin: Skin is warm and dry. No rash noted.   Cardiovascular: Normal heart rate noted  Respiratory: Normal respiratory effort, no problems with respiration noted  Abdomen: Soft, gravid, appropriate for gestational age.  Pain/Pressure: Present     Pelvic: Cervical exam deferred        Extremities: Normal range of motion.     Mental Status: Normal mood and affect. Normal behavior. Normal judgment and thought content.   Assessment and Plan:  Pregnancy: G2P0010 at [redacted]w[redacted]d 1. Supervision of high risk pregnancy, antepartum Up to date Reviewed plan of care Desires breast reduction, discussed  lactation and breast reduction  2. UTI in pregnancy, antepartum TOC neg  3. Obesity in pregnancy TWG=-2 lb 11.2 oz (-1.225 kg)  Had regular fu [place]  Preterm labor symptoms and general obstetric precautions including but not limited to vaginal bleeding, contractions, leaking of fluid and fetal movement were reviewed in detail with the patient. Please refer to After Visit Summary for other counseling recommendations.   Return in about 2 weeks (around 04/20/2022) for Routine prenatal care.  Future Appointments  Date Time Provider [place]  04/14/2022  2:15 PM [facility] NURSE [facility]  04/14/2022  2:30 PM [facility] US3 [facility]  04/21/2022  2:15 PM [facility] NURSE [facility]  04/21/2022  2:30 PM [facility] US3 [facility]  04/27/2022  2:30 PM [facility] NURSE [facility]  04/27/2022  2:45 PM [facility] US5 [facility]  05/05/2022  2:30 PM [facility] NURSE [facility]  05/05/2022  2:45 PM [facility] US5 W[facility] [place]    Caren Macadam, MD

## 2022-04-14 ENCOUNTER — Ambulatory Visit: Payer: BC Managed Care – PPO | Admitting: *Deleted

## 2022-04-14 ENCOUNTER — Ambulatory Visit: Payer: BC Managed Care – PPO | Attending: Obstetrics

## 2022-04-14 ENCOUNTER — Encounter: Payer: Self-pay | Admitting: *Deleted

## 2022-04-14 VITALS — BP 130/79 | HR 100

## 2022-04-14 DIAGNOSIS — O99210 Obesity complicating pregnancy, unspecified trimester: Secondary | ICD-10-CM | POA: Diagnosis present

## 2022-04-14 DIAGNOSIS — Z148 Genetic carrier of other disease: Secondary | ICD-10-CM | POA: Diagnosis not present

## 2022-04-14 DIAGNOSIS — O285 Abnormal chromosomal and genetic finding on antenatal screening of mother: Secondary | ICD-10-CM

## 2022-04-14 DIAGNOSIS — O0990 Supervision of high risk pregnancy, unspecified, unspecified trimester: Secondary | ICD-10-CM

## 2022-04-14 DIAGNOSIS — O99213 Obesity complicating pregnancy, third trimester: Secondary | ICD-10-CM | POA: Diagnosis not present

## 2022-04-14 DIAGNOSIS — R8271 Bacteriuria: Secondary | ICD-10-CM | POA: Diagnosis present

## 2022-04-14 DIAGNOSIS — Z6841 Body Mass Index (BMI) 40.0 and over, adult: Secondary | ICD-10-CM | POA: Insufficient documentation

## 2022-04-14 DIAGNOSIS — D563 Thalassemia minor: Secondary | ICD-10-CM

## 2022-04-14 DIAGNOSIS — O2340 Unspecified infection of urinary tract in pregnancy, unspecified trimester: Secondary | ICD-10-CM | POA: Diagnosis present

## 2022-04-14 DIAGNOSIS — O99891 Other specified diseases and conditions complicating pregnancy: Secondary | ICD-10-CM | POA: Diagnosis present

## 2022-04-14 DIAGNOSIS — Z3A32 32 weeks gestation of pregnancy: Secondary | ICD-10-CM

## 2022-04-14 DIAGNOSIS — E669 Obesity, unspecified: Secondary | ICD-10-CM

## 2022-04-18 ENCOUNTER — Other Ambulatory Visit: Payer: Self-pay | Admitting: *Deleted

## 2022-04-18 DIAGNOSIS — Z6841 Body Mass Index (BMI) 40.0 and over, adult: Secondary | ICD-10-CM

## 2022-04-21 ENCOUNTER — Ambulatory Visit: Payer: BC Managed Care – PPO | Attending: Obstetrics

## 2022-04-21 ENCOUNTER — Ambulatory Visit: Payer: BC Managed Care – PPO | Admitting: *Deleted

## 2022-04-21 VITALS — BP 114/77 | HR 118

## 2022-04-21 DIAGNOSIS — O285 Abnormal chromosomal and genetic finding on antenatal screening of mother: Secondary | ICD-10-CM

## 2022-04-21 DIAGNOSIS — O2340 Unspecified infection of urinary tract in pregnancy, unspecified trimester: Secondary | ICD-10-CM | POA: Diagnosis present

## 2022-04-21 DIAGNOSIS — D563 Thalassemia minor: Secondary | ICD-10-CM

## 2022-04-21 DIAGNOSIS — Z6841 Body Mass Index (BMI) 40.0 and over, adult: Secondary | ICD-10-CM | POA: Insufficient documentation

## 2022-04-21 DIAGNOSIS — O99891 Other specified diseases and conditions complicating pregnancy: Secondary | ICD-10-CM

## 2022-04-21 DIAGNOSIS — O99210 Obesity complicating pregnancy, unspecified trimester: Secondary | ICD-10-CM

## 2022-04-21 DIAGNOSIS — O99213 Obesity complicating pregnancy, third trimester: Secondary | ICD-10-CM

## 2022-04-21 DIAGNOSIS — Z148 Genetic carrier of other disease: Secondary | ICD-10-CM | POA: Diagnosis present

## 2022-04-21 DIAGNOSIS — R8271 Bacteriuria: Secondary | ICD-10-CM | POA: Insufficient documentation

## 2022-04-21 DIAGNOSIS — Z3A33 33 weeks gestation of pregnancy: Secondary | ICD-10-CM

## 2022-04-21 DIAGNOSIS — O0990 Supervision of high risk pregnancy, unspecified, unspecified trimester: Secondary | ICD-10-CM | POA: Diagnosis present

## 2022-04-21 DIAGNOSIS — E669 Obesity, unspecified: Secondary | ICD-10-CM

## 2022-04-23 ENCOUNTER — Other Ambulatory Visit: Payer: Self-pay | Admitting: Obstetrics & Gynecology

## 2022-04-23 DIAGNOSIS — O219 Vomiting of pregnancy, unspecified: Secondary | ICD-10-CM

## 2022-04-24 MED ORDER — ONDANSETRON 4 MG PO TBDP: 4.0000 mg | ORAL_TABLET | Freq: Four times a day (QID) | ORAL | 0 refills | Status: DC | PRN

## 2022-04-24 MED ORDER — ONDANSETRON 4 MG PO TBDP: 4.0000 mg | ORAL_TABLET | Freq: Four times a day (QID) | ORAL | 1 refills | Status: DC | PRN

## 2022-04-24 NOTE — Telephone Encounter (Signed)
Call received from pt. Pt states requesting Rx Zofran due to using it every morning for morning sickness. Pt is in third trimester. Pt states only taking 1 pill every day. Discussed with Dr Vergie Living, ok for Rx refill Zofran # 30 with 1 RF. Pt aware sent to pharmacy.  Judeth Cornfield, RN

## 2022-04-25 ENCOUNTER — Encounter: Payer: Self-pay | Admitting: *Deleted

## 2022-04-27 ENCOUNTER — Ambulatory Visit: Payer: BC Managed Care – PPO | Admitting: *Deleted

## 2022-04-27 ENCOUNTER — Ambulatory Visit: Payer: BC Managed Care – PPO | Attending: Obstetrics

## 2022-04-27 VITALS — BP 127/76 | HR 105

## 2022-04-27 DIAGNOSIS — O2340 Unspecified infection of urinary tract in pregnancy, unspecified trimester: Secondary | ICD-10-CM

## 2022-04-27 DIAGNOSIS — E669 Obesity, unspecified: Secondary | ICD-10-CM | POA: Diagnosis not present

## 2022-04-27 DIAGNOSIS — D563 Thalassemia minor: Secondary | ICD-10-CM

## 2022-04-27 DIAGNOSIS — O285 Abnormal chromosomal and genetic finding on antenatal screening of mother: Secondary | ICD-10-CM

## 2022-04-27 DIAGNOSIS — O99213 Obesity complicating pregnancy, third trimester: Secondary | ICD-10-CM

## 2022-04-27 DIAGNOSIS — Z148 Genetic carrier of other disease: Secondary | ICD-10-CM

## 2022-04-27 DIAGNOSIS — O99891 Other specified diseases and conditions complicating pregnancy: Secondary | ICD-10-CM | POA: Insufficient documentation

## 2022-04-27 DIAGNOSIS — Z6841 Body Mass Index (BMI) 40.0 and over, adult: Secondary | ICD-10-CM | POA: Diagnosis present

## 2022-04-27 DIAGNOSIS — R8271 Bacteriuria: Secondary | ICD-10-CM | POA: Insufficient documentation

## 2022-04-27 DIAGNOSIS — Z3A34 34 weeks gestation of pregnancy: Secondary | ICD-10-CM

## 2022-04-27 DIAGNOSIS — O0990 Supervision of high risk pregnancy, unspecified, unspecified trimester: Secondary | ICD-10-CM

## 2022-04-27 DIAGNOSIS — O99210 Obesity complicating pregnancy, unspecified trimester: Secondary | ICD-10-CM

## 2022-04-28 ENCOUNTER — Encounter: Payer: Self-pay | Admitting: Radiology

## 2022-04-28 ENCOUNTER — Encounter: Payer: BC Managed Care – PPO | Admitting: Obstetrics & Gynecology

## 2022-05-01 ENCOUNTER — Ambulatory Visit (INDEPENDENT_AMBULATORY_CARE_PROVIDER_SITE_OTHER): Payer: BC Managed Care – PPO

## 2022-05-01 VITALS — BP 118/79 | HR 121 | Wt 291.0 lb

## 2022-05-01 DIAGNOSIS — O99210 Obesity complicating pregnancy, unspecified trimester: Secondary | ICD-10-CM

## 2022-05-01 DIAGNOSIS — O0990 Supervision of high risk pregnancy, unspecified, unspecified trimester: Secondary | ICD-10-CM

## 2022-05-01 DIAGNOSIS — Z0289 Encounter for other administrative examinations: Secondary | ICD-10-CM

## 2022-05-01 DIAGNOSIS — Z3A35 35 weeks gestation of pregnancy: Secondary | ICD-10-CM

## 2022-05-01 NOTE — Progress Notes (Signed)
   PRENATAL VISIT NOTE  Subjective:  Renee Lester is a 26 y.o. G2P0010 at [redacted]w[redacted]d being seen today for ongoing prenatal care.  She is currently monitored for the following issues for this low-risk pregnancy and has Urinary frequency; Polycystic ovary syndrome; Class 3 severe obesity due to excess calories without serious comorbidity with body mass index (BMI) of 40.0 to 44.9 in adult [facility]); BV (bacterial vaginosis); Supervision of high risk pregnancy, antepartum; Obesity in pregnancy; UTI in pregnancy, antepartum; Alpha thalassemia silent carrier; Carrier of spinal muscular atrophy; Asymptomatic bacteriuria during pregnancy in second trimester; and Hearing loss due to cerumen impaction on their problem list.  Patient reports no complaints.  Contractions: Not present. Vag. Bleeding: None.  Movement: Present. Denies leaking of fluid.   The following portions of the patient's history were reviewed and updated as appropriate: allergies, current medications, past family history, past medical history, past social history, past surgical history and problem list.   Objective:   Vitals:   05/01/22 1518  BP: 118/79  Pulse: (!) 121  Weight: 291 lb (132 kg)    Fetal Status: Fetal Heart Rate (bpm): 145 Fundal Height: 35 cm Movement: Present     General:  Alert, oriented and cooperative. Patient is in no acute distress.  Skin: Skin is warm and dry. No rash noted.   Cardiovascular: Normal heart rate noted  Respiratory: Normal respiratory effort, no problems with respiration noted  Abdomen: Soft, gravid, appropriate for gestational age.  Pain/Pressure: Absent     Pelvic: Cervical exam deferred        Extremities: Normal range of motion.     Mental Status: Normal mood and affect. Normal behavior. Normal judgment and thought content.   Assessment and Plan:  Pregnancy: G2P0010 at [redacted]w[redacted]d 1. Supervision of high risk pregnancy, antepartum - Routine OB. Doing well, no concerns - Anticipatory guidance for  upcoming appointments provided. GBS and GC/CT next visit  2. [redacted] weeks gestation of pregnancy - Endorses active fetal movement - FH appropriate  3. Obesity in pregnancy - Continue bASA   Preterm labor symptoms and general obstetric precautions including but not limited to vaginal bleeding, contractions, leaking of fluid and fetal movement were reviewed in detail with the patient. Please refer to After Visit Summary for other counseling recommendations.   Return in about 1 week (around 05/08/2022) for LOB.  Future Appointments  Date Time Provider [place]  05/05/2022  2:30 PM [facility] NURSE [facility]  05/05/2022  2:45 PM [facility] US5 [facility]  05/11/2022  2:15 PM [facility] NURSE [facility]  05/11/2022  2:30 PM [facility] US2 [facility]  05/15/2022  2:15 PM Aletha Halim, MD [facility]    Renee Harder, CNM

## 2022-05-05 ENCOUNTER — Encounter: Payer: Self-pay | Admitting: Radiology

## 2022-05-05 ENCOUNTER — Ambulatory Visit: Payer: BC Managed Care – PPO | Admitting: *Deleted

## 2022-05-05 ENCOUNTER — Encounter: Payer: Self-pay | Admitting: *Deleted

## 2022-05-05 ENCOUNTER — Ambulatory Visit: Payer: BC Managed Care – PPO | Attending: Obstetrics

## 2022-05-05 VITALS — BP 126/78 | HR 102

## 2022-05-05 DIAGNOSIS — O0990 Supervision of high risk pregnancy, unspecified, unspecified trimester: Secondary | ICD-10-CM

## 2022-05-05 DIAGNOSIS — Z148 Genetic carrier of other disease: Secondary | ICD-10-CM | POA: Diagnosis present

## 2022-05-05 DIAGNOSIS — O285 Abnormal chromosomal and genetic finding on antenatal screening of mother: Secondary | ICD-10-CM

## 2022-05-05 DIAGNOSIS — O99213 Obesity complicating pregnancy, third trimester: Secondary | ICD-10-CM | POA: Diagnosis not present

## 2022-05-05 DIAGNOSIS — E669 Obesity, unspecified: Secondary | ICD-10-CM

## 2022-05-05 DIAGNOSIS — O99891 Other specified diseases and conditions complicating pregnancy: Secondary | ICD-10-CM

## 2022-05-05 DIAGNOSIS — R8271 Bacteriuria: Secondary | ICD-10-CM | POA: Insufficient documentation

## 2022-05-05 DIAGNOSIS — O2340 Unspecified infection of urinary tract in pregnancy, unspecified trimester: Secondary | ICD-10-CM

## 2022-05-05 DIAGNOSIS — Z3A35 35 weeks gestation of pregnancy: Secondary | ICD-10-CM

## 2022-05-05 DIAGNOSIS — O99210 Obesity complicating pregnancy, unspecified trimester: Secondary | ICD-10-CM | POA: Diagnosis present

## 2022-05-05 DIAGNOSIS — Z6841 Body Mass Index (BMI) 40.0 and over, adult: Secondary | ICD-10-CM | POA: Insufficient documentation

## 2022-05-05 DIAGNOSIS — D563 Thalassemia minor: Secondary | ICD-10-CM | POA: Insufficient documentation

## 2022-05-08 ENCOUNTER — Other Ambulatory Visit: Payer: Self-pay | Admitting: *Deleted

## 2022-05-08 DIAGNOSIS — O99213 Obesity complicating pregnancy, third trimester: Secondary | ICD-10-CM

## 2022-05-11 ENCOUNTER — Ambulatory Visit: Payer: BC Managed Care – PPO | Attending: Obstetrics and Gynecology

## 2022-05-11 ENCOUNTER — Ambulatory Visit: Payer: BC Managed Care – PPO | Admitting: *Deleted

## 2022-05-11 VITALS — BP 121/83 | HR 105

## 2022-05-11 DIAGNOSIS — O99891 Other specified diseases and conditions complicating pregnancy: Secondary | ICD-10-CM | POA: Diagnosis present

## 2022-05-11 DIAGNOSIS — Z148 Genetic carrier of other disease: Secondary | ICD-10-CM | POA: Diagnosis present

## 2022-05-11 DIAGNOSIS — O99210 Obesity complicating pregnancy, unspecified trimester: Secondary | ICD-10-CM | POA: Diagnosis present

## 2022-05-11 DIAGNOSIS — O99213 Obesity complicating pregnancy, third trimester: Secondary | ICD-10-CM | POA: Diagnosis not present

## 2022-05-11 DIAGNOSIS — D563 Thalassemia minor: Secondary | ICD-10-CM

## 2022-05-11 DIAGNOSIS — O0990 Supervision of high risk pregnancy, unspecified, unspecified trimester: Secondary | ICD-10-CM

## 2022-05-11 DIAGNOSIS — Z6841 Body Mass Index (BMI) 40.0 and over, adult: Secondary | ICD-10-CM | POA: Diagnosis present

## 2022-05-11 DIAGNOSIS — R8271 Bacteriuria: Secondary | ICD-10-CM | POA: Insufficient documentation

## 2022-05-11 DIAGNOSIS — E669 Obesity, unspecified: Secondary | ICD-10-CM

## 2022-05-11 DIAGNOSIS — O2340 Unspecified infection of urinary tract in pregnancy, unspecified trimester: Secondary | ICD-10-CM

## 2022-05-11 DIAGNOSIS — O285 Abnormal chromosomal and genetic finding on antenatal screening of mother: Secondary | ICD-10-CM | POA: Diagnosis not present

## 2022-05-11 DIAGNOSIS — Z3A36 36 weeks gestation of pregnancy: Secondary | ICD-10-CM | POA: Insufficient documentation

## 2022-05-12 ENCOUNTER — Encounter: Payer: Self-pay | Admitting: Obstetrics & Gynecology

## 2022-05-15 ENCOUNTER — Encounter: Payer: BC Managed Care – PPO | Admitting: Obstetrics and Gynecology

## 2022-05-15 ENCOUNTER — Telehealth (INDEPENDENT_AMBULATORY_CARE_PROVIDER_SITE_OTHER): Payer: BC Managed Care – PPO | Admitting: Obstetrics and Gynecology

## 2022-05-15 DIAGNOSIS — O99210 Obesity complicating pregnancy, unspecified trimester: Secondary | ICD-10-CM

## 2022-05-15 DIAGNOSIS — O0990 Supervision of high risk pregnancy, unspecified, unspecified trimester: Secondary | ICD-10-CM

## 2022-05-15 DIAGNOSIS — Z3A37 37 weeks gestation of pregnancy: Secondary | ICD-10-CM

## 2022-05-15 DIAGNOSIS — O0993 Supervision of high risk pregnancy, unspecified, third trimester: Secondary | ICD-10-CM

## 2022-05-22 ENCOUNTER — Other Ambulatory Visit: Payer: BC Managed Care – PPO

## 2022-05-24 ENCOUNTER — Other Ambulatory Visit (HOSPITAL_COMMUNITY)
Admission: RE | Admit: 2022-05-24 | Discharge: 2022-05-24 | Disposition: A | Discharge status: Home / Self Care | Payer: BC Managed Care – PPO | Source: Ambulatory Visit

## 2022-05-24 ENCOUNTER — Ambulatory Visit (INDEPENDENT_AMBULATORY_CARE_PROVIDER_SITE_OTHER): Payer: BC Managed Care – PPO

## 2022-05-24 ENCOUNTER — Ambulatory Visit: Payer: BC Managed Care – PPO | Admitting: *Deleted

## 2022-05-24 ENCOUNTER — Other Ambulatory Visit: Payer: Self-pay

## 2022-05-24 ENCOUNTER — Encounter: Payer: BC Managed Care – PPO | Admitting: Certified Nurse Midwife

## 2022-05-24 ENCOUNTER — Ambulatory Visit (INDEPENDENT_AMBULATORY_CARE_PROVIDER_SITE_OTHER): Payer: BC Managed Care – PPO | Admitting: Certified Nurse Midwife

## 2022-05-24 VITALS — BP 129/93 | HR 103 | Wt 299.7 lb

## 2022-05-24 DIAGNOSIS — O134 Gestational [pregnancy-induced] hypertension without significant proteinuria, complicating childbirth: Secondary | ICD-10-CM | POA: Diagnosis not present

## 2022-05-24 DIAGNOSIS — R030 Elevated blood-pressure reading, without diagnosis of hypertension: Secondary | ICD-10-CM

## 2022-05-24 DIAGNOSIS — Z3A38 38 weeks gestation of pregnancy: Secondary | ICD-10-CM

## 2022-05-24 DIAGNOSIS — O99210 Obesity complicating pregnancy, unspecified trimester: Secondary | ICD-10-CM

## 2022-05-24 DIAGNOSIS — O368130 Decreased fetal movements, third trimester, not applicable or unspecified: Secondary | ICD-10-CM | POA: Diagnosis not present

## 2022-05-24 DIAGNOSIS — O0993 Supervision of high risk pregnancy, unspecified, third trimester: Secondary | ICD-10-CM

## 2022-05-24 NOTE — Progress Notes (Signed)
   PRENATAL VISIT NOTE  Subjective:  Renee Lester is a 26 y.o. G2P0010 at [redacted]w[redacted]d being seen today for ongoing prenatal care.  She is currently monitored for the following issues for this high-risk pregnancy and has Polycystic ovary syndrome; BV (bacterial vaginosis); Supervision of high risk pregnancy, antepartum; Obesity in pregnancy; UTI in pregnancy, antepartum; Alpha thalassemia silent carrier; Carrier of spinal muscular atrophy; Asymptomatic bacteriuria during pregnancy in second trimester; and Hearing loss due to cerumen impaction on their problem list.  Patient reports no complaints.  Contractions: Irritability. Vag. Bleeding: None.  Movement: Present. Denies leaking of fluid.   The following portions of the patient's history were reviewed and updated as appropriate: allergies, current medications, past family history, past medical history, past social history, past surgical history and problem list.   Objective:   Vitals:   05/24/22 1453  BP: (!) 129/93  Pulse: (!) 103  Weight: 299 lb 11.2 oz (135.9 kg)    Fetal Status: Fetal Heart Rate (bpm): 135 Fundal Height: 38 cm Movement: Present  Presentation: Vertex  General:  Alert, oriented and cooperative. Patient is in no acute distress.  Skin: Skin is warm and dry. No rash noted.   Cardiovascular: Normal heart rate noted  Respiratory: Normal respiratory effort, no problems with respiration noted  Abdomen: Soft, gravid, appropriate for gestational age.  Pain/Pressure: Present     Pelvic: Cervical exam performed in the presence of a chaperone Dilation: Fingertip Effacement (%): 80 Station: -2  Extremities: Normal range of motion.  Edema: None  Mental Status: Normal mood and affect. Normal behavior. Normal judgment and thought content.   Assessment and Plan:  Pregnancy: G2P0010 at [redacted]w[redacted]d 1. Supervision of high risk pregnancy in third trimester - Doing well, feeling regular and vigorous fetal movement  - Reviewed MFM recommendation  for IOL at 39wks based on low-normal (but not FGR) fetal weight and BMI. BP to date has been stable to date, no GDM, weight carried largely in her chest and hip/thighs.  - Using shared decision making, pt elected to wait for IOL until 40wks, this increasing her changes of SOL and decreasing chances of needing a primary CS for arrest of dilation/descent - IOL scheduled for 7/16, orders in . - Reviewed induction methods - pt understands if BP changes or BPP/NST indicates, she will need earlier IOL  2. [redacted] weeks gestation of pregnancy - Routine OB care  - Culture, beta strep (group b only) - GC/Chlamydia probe amp ([facility])not at [facility]  3. Elevated blood pressure reading - 129/93 today, will recheck at next visit (first elevated reading in pregnancy)  Term labor symptoms and general obstetric precautions including but not limited to vaginal bleeding, contractions, leaking of fluid and fetal movement were reviewed in detail with the patient. Please refer to After Visit Summary for other counseling recommendations.   Return in about 8 days (around 06/01/2022) for as scheduled.  Future Appointments  Date Time Provider Department Center  06/01/2022 11:15 AM [facility] NST [facility]  06/01/2022  2:55 PM Kooistra, Charlesetta Garibaldi, CNM [facility]    Bernerd Limbo, CNM

## 2022-05-24 NOTE — Progress Notes (Signed)
Pt states has been Colder than normal.

## 2022-05-25 ENCOUNTER — Other Ambulatory Visit: Payer: Self-pay

## 2022-05-25 ENCOUNTER — Telehealth: Payer: Self-pay

## 2022-05-25 ENCOUNTER — Encounter (HOSPITAL_COMMUNITY): Payer: Self-pay | Admitting: Obstetrics & Gynecology

## 2022-05-25 ENCOUNTER — Inpatient Hospital Stay (HOSPITAL_COMMUNITY): Payer: BC Managed Care – PPO | Admitting: Anesthesiology

## 2022-05-25 ENCOUNTER — Inpatient Hospital Stay (HOSPITAL_COMMUNITY)
Admission: AD | Admit: 2022-05-25 | Discharge: 2022-05-27 | DRG: 807 | Disposition: A | Discharge status: Home / Self Care | Payer: BC Managed Care – PPO | Attending: Obstetrics and Gynecology | Admitting: Obstetrics and Gynecology

## 2022-05-25 DIAGNOSIS — Z3A38 38 weeks gestation of pregnancy: Secondary | ICD-10-CM | POA: Diagnosis not present

## 2022-05-25 DIAGNOSIS — O99214 Obesity complicating childbirth: Secondary | ICD-10-CM | POA: Diagnosis present

## 2022-05-25 DIAGNOSIS — O368130 Decreased fetal movements, third trimester, not applicable or unspecified: Secondary | ICD-10-CM | POA: Diagnosis present

## 2022-05-25 DIAGNOSIS — R8271 Bacteriuria: Secondary | ICD-10-CM

## 2022-05-25 DIAGNOSIS — O99891 Other specified diseases and conditions complicating pregnancy: Secondary | ICD-10-CM

## 2022-05-25 DIAGNOSIS — Z7982 Long term (current) use of aspirin: Secondary | ICD-10-CM

## 2022-05-25 DIAGNOSIS — O2340 Unspecified infection of urinary tract in pregnancy, unspecified trimester: Secondary | ICD-10-CM

## 2022-05-25 DIAGNOSIS — O134 Gestational [pregnancy-induced] hypertension without significant proteinuria, complicating childbirth: Principal | ICD-10-CM | POA: Diagnosis present

## 2022-05-25 DIAGNOSIS — O139 Gestational [pregnancy-induced] hypertension without significant proteinuria, unspecified trimester: Secondary | ICD-10-CM

## 2022-05-25 DIAGNOSIS — O99284 Endocrine, nutritional and metabolic diseases complicating childbirth: Secondary | ICD-10-CM | POA: Diagnosis present

## 2022-05-25 DIAGNOSIS — E282 Polycystic ovarian syndrome: Secondary | ICD-10-CM | POA: Diagnosis present

## 2022-05-25 DIAGNOSIS — O0993 Supervision of high risk pregnancy, unspecified, third trimester: Secondary | ICD-10-CM

## 2022-05-25 DIAGNOSIS — O99210 Obesity complicating pregnancy, unspecified trimester: Secondary | ICD-10-CM

## 2022-05-25 DIAGNOSIS — D563 Thalassemia minor: Secondary | ICD-10-CM | POA: Diagnosis present

## 2022-05-25 DIAGNOSIS — Z148 Genetic carrier of other disease: Secondary | ICD-10-CM

## 2022-05-25 DIAGNOSIS — O368190 Decreased fetal movements, unspecified trimester, not applicable or unspecified: Secondary | ICD-10-CM | POA: Diagnosis not present

## 2022-05-25 DIAGNOSIS — O0990 Supervision of high risk pregnancy, unspecified, unspecified trimester: Secondary | ICD-10-CM

## 2022-05-25 LAB — COMPREHENSIVE METABOLIC PANEL
ALT: 13 U/L (ref 0–44)
AST: 19 U/L (ref 15–41)
Albumin: 2.8 g/dL — ABNORMAL LOW (ref 3.5–5.0)
Alkaline Phosphatase: 154 U/L — ABNORMAL HIGH (ref 38–126)
Anion gap: 11 (ref 5–15)
BUN: 7 mg/dL (ref 6–20)
CO2: 18 mmol/L — ABNORMAL LOW (ref 22–32)
Calcium: 9.1 mg/dL (ref 8.9–10.3)
Chloride: 108 mmol/L (ref 98–111)
Creatinine, Ser: 0.89 mg/dL (ref 0.44–1.00)
GFR, Estimated: >60 mL/min (ref >60)
Glucose, Bld: 117 mg/dL — ABNORMAL HIGH (ref 70–99)
Potassium: 3.5 mmol/L (ref 3.5–5.1)
Sodium: 137 mmol/L (ref 135–145)
Total Bilirubin: 1.1 mg/dL (ref 0.3–1.2)
Total Protein: 6.4 g/dL — ABNORMAL LOW (ref 6.5–8.1)

## 2022-05-25 LAB — CBC
HCT: 35.9 % — ABNORMAL LOW (ref 36.0–46.0)
HCT: 33.1 % — ABNORMAL LOW (ref 36.0–46.0)
Hemoglobin: 11.7 g/dL — ABNORMAL LOW (ref 12.0–15.0)
Hemoglobin: 11.3 g/dL — ABNORMAL LOW (ref 12.0–15.0)
MCH: 27.8 pg (ref 26.0–34.0)
MCH: 28 pg (ref 26.0–34.0)
MCHC: 32.6 g/dL (ref 30.0–36.0)
MCHC: 34.1 g/dL (ref 30.0–36.0)
MCV: 85.3 fL (ref 80.0–100.0)
MCV: 81.9 fL (ref 80.0–100.0)
Platelets: 266 10*3/uL (ref 150–400)
Platelets: 248 10*3/uL (ref 150–400)
RBC: 4.21 MIL/uL (ref 3.87–5.11)
RBC: 4.04 MIL/uL (ref 3.87–5.11)
RDW: 15.4 % (ref 11.5–15.5)
RDW: 15.1 % (ref 11.5–15.5)
WBC: 8.4 10*3/uL (ref 4.0–10.5)
WBC: 9.2 10*3/uL (ref 4.0–10.5)
nRBC: 0 % (ref 0.0–0.2)
nRBC: 0 % (ref 0.0–0.2)

## 2022-05-25 LAB — GC/CHLAMYDIA PROBE AMP (~~LOC~~) NOT AT ARMC
Chlamydia: NEGATIVE
Comment: NEGATIVE
Comment: NORMAL
Neisseria Gonorrhea: NEGATIVE

## 2022-05-25 LAB — URINALYSIS, ROUTINE W REFLEX MICROSCOPIC
Bacteria, UA: NONE SEEN
Bilirubin Urine: NEGATIVE
Glucose, UA: NEGATIVE mg/dL
Hgb urine dipstick: NEGATIVE
Ketones, ur: 20 mg/dL — AB
Leukocytes,Ua: NEGATIVE
Nitrite: NEGATIVE
Protein, ur: 30 mg/dL — AB
Specific Gravity, Urine: 1.03 (ref 1.005–1.030)
pH: 5 (ref 5.0–8.0)

## 2022-05-25 LAB — GROUP B STREP BY PCR: Group B strep by PCR: NEGATIVE

## 2022-05-25 LAB — PROTEIN / CREATININE RATIO, URINE
Creatinine, Urine: 340.18 mg/dL
Protein Creatinine Ratio: 0.09 mg/mg{Cre} (ref 0.00–0.15)
Total Protein, Urine: 29 mg/dL

## 2022-05-25 LAB — TYPE AND SCREEN
ABO/RH(D): B POS
Antibody Screen: NEGATIVE

## 2022-05-25 LAB — RPR: RPR Ser Ql: NONREACTIVE

## 2022-05-25 MED ORDER — LACTATED RINGERS IV SOLN: 500.0000 mL | INTRAVENOUS | Status: DC | PRN

## 2022-05-25 MED ORDER — FLEET ENEMA 7-19 GM/118ML RE ENEM: 1.0000 | ENEMA | RECTAL | Status: DC | PRN

## 2022-05-25 MED ORDER — LACTATED RINGERS IV SOLN: INTRAVENOUS | Status: DC

## 2022-05-25 MED ORDER — ONDANSETRON HCL 4 MG/2ML IJ SOLN
4.0000 mg | Freq: Four times a day (QID) | INTRAMUSCULAR | Status: DC | PRN
  Administered 2022-05-25: 4 mg via INTRAVENOUS
  Filled 2022-05-25: qty 2

## 2022-05-25 MED ORDER — MISOPROSTOL 50MCG HALF TABLET: 50.0000 ug | ORAL_TABLET | ORAL | Status: DC | PRN

## 2022-05-25 MED ORDER — FENTANYL CITRATE (PF) 100 MCG/2ML IJ SOLN
100.0000 ug | INTRAMUSCULAR | Status: DC | PRN
  Administered 2022-05-25 (×3): 100 ug via INTRAVENOUS
  Filled 2022-05-25 (×3): qty 2

## 2022-05-25 MED ORDER — EPHEDRINE 5 MG/ML INJ: 10.0000 mg | INTRAVENOUS | Status: DC | PRN

## 2022-05-25 MED ORDER — SODIUM CHLORIDE 0.9 % IV SOLN: 250.0000 mL | INTRAVENOUS | Status: DC | PRN

## 2022-05-25 MED ORDER — PHENYLEPHRINE 80 MCG/ML (10ML) SYRINGE FOR IV PUSH (FOR BLOOD PRESSURE SUPPORT): 80.0000 ug | PREFILLED_SYRINGE | INTRAVENOUS | Status: DC | PRN

## 2022-05-25 MED ORDER — PHENYLEPHRINE 80 MCG/ML (10ML) SYRINGE FOR IV PUSH (FOR BLOOD PRESSURE SUPPORT)
80.0000 ug | PREFILLED_SYRINGE | INTRAVENOUS | Status: DC | PRN
  Filled 2022-05-25: qty 10

## 2022-05-25 MED ORDER — OXYTOCIN BOLUS FROM INFUSION
333.0000 mL | Freq: Once | INTRAVENOUS | Status: AC
  Administered 2022-05-26: 333 mL via INTRAVENOUS

## 2022-05-25 MED ORDER — DIPHENHYDRAMINE HCL 50 MG/ML IJ SOLN: 12.5000 mg | INTRAMUSCULAR | Status: DC | PRN

## 2022-05-25 MED ORDER — ACETAMINOPHEN 325 MG PO TABS
650.0000 mg | ORAL_TABLET | ORAL | Status: DC | PRN
  Administered 2022-05-25: 650 mg via ORAL
  Filled 2022-05-25: qty 2

## 2022-05-25 MED ORDER — OXYCODONE-ACETAMINOPHEN 5-325 MG PO TABS: 1.0000 | ORAL_TABLET | ORAL | Status: DC | PRN

## 2022-05-25 MED ORDER — LACTATED RINGERS IV SOLN: 500.0000 mL | Freq: Once | INTRAVENOUS | Status: DC

## 2022-05-25 MED ORDER — OXYCODONE-ACETAMINOPHEN 5-325 MG PO TABS: 2.0000 | ORAL_TABLET | ORAL | Status: DC | PRN

## 2022-05-25 MED ORDER — SODIUM CHLORIDE 0.9% FLUSH: 3.0000 mL | Freq: Two times a day (BID) | INTRAVENOUS | Status: DC

## 2022-05-25 MED ORDER — OXYTOCIN-SODIUM CHLORIDE 30-0.9 UT/500ML-% IV SOLN
1.0000 m[IU]/min | INTRAVENOUS | Status: DC
  Administered 2022-05-25: 2 m[IU]/min via INTRAVENOUS
  Filled 2022-05-25: qty 500

## 2022-05-25 MED ORDER — SODIUM CHLORIDE 0.9% FLUSH: 3.0000 mL | INTRAVENOUS | Status: DC | PRN

## 2022-05-25 MED ORDER — TERBUTALINE SULFATE 1 MG/ML IJ SOLN: 0.2500 mg | Freq: Once | INTRAMUSCULAR | Status: DC | PRN

## 2022-05-25 MED ORDER — LIDOCAINE HCL (PF) 1 % IJ SOLN
30.0000 mL | INTRAMUSCULAR | Status: AC | PRN
  Administered 2022-05-26: 30 mL via SUBCUTANEOUS
  Filled 2022-05-25: qty 30

## 2022-05-25 MED ORDER — FENTANYL-BUPIVACAINE-NACL 0.5-0.125-0.9 MG/250ML-% EP SOLN
12.0000 mL/h | EPIDURAL | Status: DC | PRN
  Administered 2022-05-26: 12 mL/h via EPIDURAL
  Filled 2022-05-25: qty 250

## 2022-05-25 MED ORDER — OXYTOCIN-SODIUM CHLORIDE 30-0.9 UT/500ML-% IV SOLN: 2.5000 [IU]/h | INTRAVENOUS | Status: DC

## 2022-05-25 MED ORDER — SOD CITRATE-CITRIC ACID 500-334 MG/5ML PO SOLN: 30.0000 mL | ORAL | Status: DC | PRN

## 2022-05-25 NOTE — H&P (Addendum)
LABOR AND DELIVERY ADMISSION HISTORY AND PHYSICAL NOTE  THERESEA TRAUTMANN is a 26 y.o. female G2P0010 with IUP at [redacted]w[redacted]d by LMP presenting for DFM and vaginal spotting. Blood pressure in office 7/5 was 129/93 and today is 141/95.   She reports decreased fetal movement. She reports light vaginal bleeding. Denies contractions or leaking of fluid.  She denies headache, vision changes, chest pain, shortness of breath, right upper quadrant pain. Has some mild LE edema.   She plans on breastfeeding. Her contraception plan is: unsure.  Prenatal History/Complications: PNC at [facility]:  @[redacted]w[redacted]d , CWD, normal anatomy, cephalic presentation, posterior placenta, 11%ile, EFW 5lb 2oz  Pregnancy complications:  - Elevated BMI  Past Medical History: Past Medical History:  Diagnosis Date   BV (bacterial vaginosis) 03/06/2020   Increased urinary frequency 12/02/2018   Obesity (BMI 35.0-39.9 without comorbidity)    PCOS (polycystic ovarian syndrome)    PCOS (polycystic ovarian syndrome)    PCOS (polycystic ovarian syndrome)    Vaginal odor 10/21/2019    Past Surgical History: Past Surgical History:  Procedure Laterality Date   WISDOM TOOTH EXTRACTION  2012    Obstetrical History: OB History     Gravida  2   Para      Term      Preterm      AB  1   Living         SAB  1   IAB      Ectopic  0   Multiple  0   Live Births              Social History: Social History   Socioeconomic History   Marital status: Single    Spouse name: Not on file   Number of children: Not on file   Years of education: Not on file   Highest education level: Not on file  Occupational History   Not on file  Tobacco Use   Smoking status: Never   Smokeless tobacco: Never  Vaping Use   Vaping Use: Never used  Substance and Sexual Activity   Alcohol use: Not Currently    Comment: socially   Drug use: No   Sexual activity: Yes    Birth control/protection: None  Other Topics Concern    Not on file  Social History Narrative   Not on file   Social Determinants of Health   Financial Resource Strain: Not on file  Food Insecurity: No Food Insecurity (12/29/2021)   Hunger Vital Sign    Worried About Running Out of Food in the Last Year: Never true    Ran Out of Food in the Last Year: Never true  Transportation Needs: No Transportation Needs (12/29/2021)   PRAPARE - 02/26/2022 (Medical): No    Lack of Transportation (Non-Medical): No  Physical Activity: Not on file  Stress: Not on file  Social Connections: Not on file    Family History: Family History  Problem Relation Age of Onset   Cancer Neg Hx    Diabetes Neg Hx    Heart disease Neg Hx    Hyperlipidemia Neg Hx    Obesity Neg Hx    Stroke Neg Hx     Allergies: No Known Allergies  Medications Prior to Admission  Medication Sig Dispense Refill Last Dose   aspirin EC 81 MG tablet Take 1 tablet (81 mg total) by mouth daily. 90 tablet 3 Past Week   ondansetron (ZOFRAN-ODT) 4 MG disintegrating tablet  Take 1 tablet (4 mg total) by mouth every 6 (six) hours as needed for nausea. 30 tablet 1 05/24/2022   Prenatal Vit-Fe Fumarate-FA (PRENATAL MULTIVITAMIN) TABS tablet Take 1 tablet by mouth daily at 12 noon. 90 tablet 3 05/24/2022   ergocalciferol (VITAMIN D2) 1.25 MG (50000 UT) capsule         Review of Systems  All systems reviewed and negative except as stated in HPI  Physical Exam Blood pressure (!) 157/100, pulse (!) 127, temperature 98.5 F (36.9 C), temperature source Oral, resp. rate 20, height 5\' 8"  (1.727 m), weight (!) 136.4 kg, last menstrual period 08/28/2021, SpO2 100 %. General appearance: alert, oriented Lungs: normal respiratory effort Heart: tachycardic, regular rhythm Abdomen: soft, non-tender; normal bowel sounds Extremities: No calf swelling or tenderness Presentation: cephalic by ultrasound  Fetal monitoringBaseline: 130s bpm, Variability: Good {> 6 bpm),  Accelerations: Reactive, and Decelerations: Absent Uterine activityNone     Prenatal labs: ABO, Rh: B/Positive/-- (01/12 1526) Antibody: Negative (01/12 1526) Rubella: 1.85 (01/12 1526) RPR: Non Reactive (05/01 0934)  HBsAg: Negative (01/12 1526)  HIV: Non Reactive (05/01 0934)  GC/Chlamydia:  Neisseria Gonorrhea  Date Value Ref Range Status  03/06/2022 Negative  Final   Chlamydia  Date Value Ref Range Status  03/06/2022 Negative  Final   GBS:    2-hr GTT: normal Genetic screening:  normal  Anatomy 03/08/2022: normal anatomy,   Prenatal Transfer Tool  Maternal Diabetes: No Genetic Screening: Normal Maternal Ultrasounds/Referrals: Normal Fetal Ultrasounds or other Referrals:  None Maternal Substance Abuse:  No Significant Maternal Medications:  None Significant Maternal Lab Results: None  Results for orders placed or performed during the hospital encounter of 05/25/22 (from the past 24 hour(s))  Urinalysis, Routine w reflex microscopic Urine, Clean Catch   Collection Time: 05/25/22  8:48 AM  Result Value Ref Range   Color, Urine AMBER (A) YELLOW   APPearance CLEAR CLEAR   Specific Gravity, Urine 1.030 1.005 - 1.030   pH 5.0 5.0 - 8.0   Glucose, UA NEGATIVE NEGATIVE mg/dL   Hgb urine dipstick NEGATIVE NEGATIVE   Bilirubin Urine NEGATIVE NEGATIVE   Ketones, ur 20 (A) NEGATIVE mg/dL   Protein, ur 30 (A) NEGATIVE mg/dL   Nitrite NEGATIVE NEGATIVE   Leukocytes,Ua NEGATIVE NEGATIVE   RBC / HPF 0-5 0 - 5 RBC/hpf   WBC, UA 0-5 0 - 5 WBC/hpf   Bacteria, UA NONE SEEN NONE SEEN   Squamous Epithelial / LPF 0-5 0 - 5   Mucus PRESENT     Patient Active Problem List   Diagnosis Date Noted   Hearing loss due to cerumen impaction 03/22/2022   Alpha thalassemia silent carrier 12/19/2021   Carrier of spinal muscular atrophy 12/19/2021   Asymptomatic bacteriuria during pregnancy in second trimester 12/19/2021   UTI in pregnancy, antepartum 12/04/2021   Supervision of high risk  pregnancy, antepartum 11/03/2021   Obesity in pregnancy 11/03/2021   BV (bacterial vaginosis) 03/06/2020   Polycystic ovary syndrome 11/26/2018    Assessment: Renee Lester is a 26 y.o. G2P0010 at [redacted]w[redacted]d here for decreased fetal movement, vaginal spotting, and newly diagnosed gestational hypertension.  #Labor: Plan to induce for gestation hypertension. We spoke with patient about initiating induction in MAU as we are not sure when she will be able to be transferred to L&D, and recommended a foley balloon. Patient declined. We discussed that moving towards delivery in a timely fashion would help to prevent complications such as the progression of her  gestational hypertension to pre-eclampsia or eclampsia. Patient still declined. I tried to elucidate her concerns as far as why she did not want to start, if it was related to pain control or not having family present yet, she stated that she felt it was not urgent and she would prefer to wait until she was on L&D. We agreed to re-visit the issue if she is still not on L&D within the next two hours.  #Pain: IV pain meds PRN, epidural upon request #FWB: Cat 1 #GBS/ID: GBS unknown. Culture collected yesterday in clinic, will obtain PCR today #MOF: breastfeeding #MOC: unsure #Circ: yes  #Gestational hypertension: Unremarkable labs though notably Cr is creeping up at 0.89, she is asymptomatic. Repeat labs in 12 hours.   Gae Gallop, Medical Student 05/25/2022, 9:27 AM     ATTENDING ATTESTATION  I have seen and examined this patient and agree with the above documentation in the medical student's note except as below.  I have edited the above for clarity and accuracy  Venora Maples, MD/[facility] [employer] (Faculty Practice) 05/25/2022, 10:28 AM

## 2022-05-25 NOTE — Progress Notes (Signed)
Dr. Mathis Fare and I at bedside to meet patient. Patient requested to shower before we start the process. Dr. Mathis Fare reviewed MAU tracing and is okay with patient to shower before we put her on the fetal heart monitor and start the induction process

## 2022-05-25 NOTE — Progress Notes (Signed)
LABOR PROGRESS NOTE  Renee Lester is a 26 y.o. G2P0010 at [redacted]w[redacted]d  admitted for IOL for gHTN.  Subjective: No complaints Ok for foley bulb insertion while in MAU  Objective: BP 136/82 (BP Location: Left Arm)   Pulse (!) 101   Temp 98.1 F (36.7 C) (Oral)   Resp 19   Ht 5\' 8"  (1.727 m)   Wt (!) 136.4 kg   LMP 08/28/2021   SpO2 100%   BMI 45.71 kg/m  or  Vitals:   05/25/22 1015 05/25/22 1030 05/25/22 1045 05/25/22 1235  BP: (!) 141/95 130/88 (!) 132/102 136/82  Pulse: (!) 114 (!) 115 (!) 106 (!) 101  Resp:    19  Temp:    98.1 F (36.7 C)  TempSrc:    Oral  SpO2:    100%  Weight:      Height:         Presentation: Vertex (by bsus) Exam by:: Dr. [email or web address]  Vertex 1.5/90/0  FHT: baseline rate 135, moderate varibility, +acel, no decel Toco: quiet  Labs: Lab Results  Component Value Date   WBC 8.4 05/25/2022   HGB 11.7 (L) 05/25/2022   HCT 35.9 (L) 05/25/2022   MCV 85.3 05/25/2022   PLT 266 05/25/2022    Patient Active Problem List   Diagnosis Date Noted   Hearing loss due to cerumen impaction 03/22/2022   Alpha thalassemia silent carrier 12/19/2021   Carrier of spinal muscular atrophy 12/19/2021   Asymptomatic bacteriuria during pregnancy in second trimester 12/19/2021   UTI in pregnancy, antepartum 12/04/2021   Supervision of high risk pregnancy, antepartum 11/03/2021   Obesity in pregnancy 11/03/2021   BV (bacterial vaginosis) 03/06/2020   Polycystic ovary syndrome 11/26/2018    Assessment / Plan: 26 y.o. G2P0010 at [redacted]w[redacted]d here for IOL for gHTN.  Labor: has been boarding in MAU due to staffing issues. After verbal consent foley balloon placed in the cervix easily with 60 cc at 1530. Further induction/augmentation once she arrives to L&D.  Fetal Wellbeing:  Cat I Pain Control:  IV pain meds PRN, epidural upon request GBS: PCR negative, culture pending Anticipated MOD:  NSVD  gHTN: normotensive to mild range, asymptomatic on admission. Cr was 0.89  on initial check, plan to repeat labs at 2000  [redacted]w[redacted]d, MD/MPH Attending Family Medicine Physician, [facility] for [facility], [facility]   05/25/2022, 3:29 PM

## 2022-05-25 NOTE — Telephone Encounter (Signed)
IOL scheduled for 06/04/22 daytime. MyChart message sent, pt currently in MAU for eval.

## 2022-05-25 NOTE — MAU Note (Signed)
Renee Lester is a 26 y.o. at [redacted]w[redacted]d here in MAU reporting: intermittent sharp lower abdominal pain and "light" VB.  States abd pain began this morning @ 0500. Reports checked in office yesterday, cervix 1cm.  Reports no FM today, last movements felt were last night.  Denies LOF. LMP: N/A Onset of complaint: today Pain score: 8/10 Vitals:   05/25/22 0841  BP: 140/88  Pulse: (!) 121  Resp: 20  Temp: 98.5 F (36.9 C)  SpO2: 98%     FHT: 145 bpm Lab orders placed from triage: UA

## 2022-05-25 NOTE — Anesthesia Preprocedure Evaluation (Signed)
Anesthesia Evaluation  Patient identified by MRN, date of birth, ID band Patient awake    Reviewed: Allergy & Precautions, Patient's Chart, lab work & pertinent test results  Airway Mallampati: II  TM Distance: >3 FB Neck ROM: Full    Dental no notable dental hx.    Pulmonary neg pulmonary ROS,    Pulmonary exam normal breath sounds clear to auscultation       Cardiovascular negative cardio ROS Normal cardiovascular exam Rhythm:Regular Rate:Normal     Neuro/Psych negative neurological ROS     GI/Hepatic negative GI ROS, Neg liver ROS,   Endo/Other  Morbid obesity  Renal/GU negative Renal ROS     Musculoskeletal negative musculoskeletal ROS (+)   Abdominal (+) + obese,   Peds  Hematology negative hematology ROS (+)   Anesthesia Other Findings   Reproductive/Obstetrics (+) Pregnancy                             Anesthesia Physical Anesthesia Plan  ASA: 3  Anesthesia Plan: Epidural   Post-op Pain Management:    Induction:   PONV Risk Score and Plan:   Airway Management Planned:   Additional Equipment:   Intra-op Plan:   Post-operative Plan:   Informed Consent: I have reviewed the patients History and Physical, chart, labs and discussed the procedure including the risks, benefits and alternatives for the proposed anesthesia with the patient or authorized representative who has indicated his/her understanding and acceptance.       Plan Discussed with:   Anesthesia Plan Comments:         Anesthesia Quick Evaluation

## 2022-05-26 ENCOUNTER — Encounter (HOSPITAL_COMMUNITY): Payer: Self-pay | Admitting: Family Medicine

## 2022-05-26 DIAGNOSIS — O134 Gestational [pregnancy-induced] hypertension without significant proteinuria, complicating childbirth: Secondary | ICD-10-CM

## 2022-05-26 DIAGNOSIS — O368190 Decreased fetal movements, unspecified trimester, not applicable or unspecified: Secondary | ICD-10-CM

## 2022-05-26 DIAGNOSIS — O139 Gestational [pregnancy-induced] hypertension without significant proteinuria, unspecified trimester: Secondary | ICD-10-CM

## 2022-05-26 DIAGNOSIS — Z3A38 38 weeks gestation of pregnancy: Secondary | ICD-10-CM

## 2022-05-26 HISTORY — DX: Gestational (pregnancy-induced) hypertension without significant proteinuria, unspecified trimester: O13.9

## 2022-05-26 LAB — CBC
HCT: 30.6 % — ABNORMAL LOW (ref 36.0–46.0)
Hemoglobin: 10.4 g/dL — ABNORMAL LOW (ref 12.0–15.0)
MCH: 28.1 pg (ref 26.0–34.0)
MCHC: 34 g/dL (ref 30.0–36.0)
MCV: 82.7 fL (ref 80.0–100.0)
Platelets: 213 10*3/uL (ref 150–400)
RBC: 3.7 MIL/uL — ABNORMAL LOW (ref 3.87–5.11)
RDW: 15.3 % (ref 11.5–15.5)
WBC: 15.3 10*3/uL — ABNORMAL HIGH (ref 4.0–10.5)
nRBC: 0 % (ref 0.0–0.2)

## 2022-05-26 MED ORDER — ZOLPIDEM TARTRATE 5 MG PO TABS: 5.0000 mg | ORAL_TABLET | Freq: Every evening | ORAL | Status: DC | PRN

## 2022-05-26 MED ORDER — COCONUT OIL OIL: 1.0000 | TOPICAL_OIL | Status: DC | PRN

## 2022-05-26 MED ORDER — PRENATAL MULTIVITAMIN CH
1.0000 | ORAL_TABLET | Freq: Every day | ORAL | Status: DC
Start: 2022-05-26 — End: 2022-05-28
  Administered 2022-05-27: 1 via ORAL
  Filled 2022-05-26: qty 1

## 2022-05-26 MED ORDER — ACETAMINOPHEN 325 MG PO TABS: 650.0000 mg | ORAL_TABLET | ORAL | Status: DC | PRN

## 2022-05-26 MED ORDER — DIPHENHYDRAMINE HCL 25 MG PO CAPS: 25.0000 mg | ORAL_CAPSULE | Freq: Four times a day (QID) | ORAL | Status: DC | PRN

## 2022-05-26 MED ORDER — TETANUS-DIPHTH-ACELL PERTUSSIS 5-2.5-18.5 LF-MCG/0.5 IM SUSY: 0.5000 mL | PREFILLED_SYRINGE | Freq: Once | INTRAMUSCULAR | Status: DC

## 2022-05-26 MED ORDER — LIDOCAINE-EPINEPHRINE (PF) 1.5 %-1:200000 IJ SOLN
INTRAMUSCULAR | Status: DC | PRN
  Administered 2022-05-26: 5 mL via EPIDURAL

## 2022-05-26 MED ORDER — SENNOSIDES-DOCUSATE SODIUM 8.6-50 MG PO TABS
2.0000 | ORAL_TABLET | ORAL | Status: DC
  Administered 2022-05-26: 2 via ORAL
  Filled 2022-05-26: qty 2

## 2022-05-26 MED ORDER — FUROSEMIDE 20 MG PO TABS
20.0000 mg | ORAL_TABLET | Freq: Every day | ORAL | Status: DC
Start: 2022-05-27 — End: 2022-05-28
  Administered 2022-05-27: 20 mg via ORAL
  Filled 2022-05-26: qty 1

## 2022-05-26 MED ORDER — ONDANSETRON HCL 4 MG/2ML IJ SOLN: 4.0000 mg | INTRAMUSCULAR | Status: DC | PRN

## 2022-05-26 MED ORDER — MEASLES, MUMPS & RUBELLA VAC IJ SOLR: 0.5000 mL | Freq: Once | INTRAMUSCULAR | Status: DC

## 2022-05-26 MED ORDER — SODIUM CHLORIDE 0.9% FLUSH: 3.0000 mL | INTRAVENOUS | Status: DC | PRN

## 2022-05-26 MED ORDER — WITCH HAZEL-GLYCERIN EX PADS
1.0000 | MEDICATED_PAD | CUTANEOUS | Status: DC | PRN
  Administered 2022-05-26: 1 via TOPICAL

## 2022-05-26 MED ORDER — SIMETHICONE 80 MG PO CHEW: 80.0000 mg | CHEWABLE_TABLET | ORAL | Status: DC | PRN

## 2022-05-26 MED ORDER — SODIUM CHLORIDE 0.9 % IV SOLN: INTRAVENOUS | Status: DC | PRN

## 2022-05-26 MED ORDER — SODIUM CHLORIDE 0.9% FLUSH: 3.0000 mL | Freq: Two times a day (BID) | INTRAVENOUS | Status: DC

## 2022-05-26 MED ORDER — IBUPROFEN 600 MG PO TABS
600.0000 mg | ORAL_TABLET | Freq: Four times a day (QID) | ORAL | Status: DC
  Administered 2022-05-26 – 2022-05-27 (×5): 600 mg via ORAL
  Filled 2022-05-26 (×5): qty 1

## 2022-05-26 MED ORDER — BENZOCAINE-MENTHOL 20-0.5 % EX AERO
1.0000 | INHALATION_SPRAY | CUTANEOUS | Status: DC | PRN
  Administered 2022-05-26: 1 via TOPICAL
  Filled 2022-05-26: qty 56

## 2022-05-26 MED ORDER — DIBUCAINE (PERIANAL) 1 % EX OINT: 1.0000 | TOPICAL_OINTMENT | CUTANEOUS | Status: DC | PRN

## 2022-05-26 MED ORDER — ONDANSETRON HCL 4 MG PO TABS: 4.0000 mg | ORAL_TABLET | ORAL | Status: DC | PRN

## 2022-05-26 NOTE — Lactation Note (Signed)
This note was copied from a baby's chart. Lactation Consultation Note  Patient Name: Renee Lester [id number] Date: 05/26/2022 Reason for consult: Initial assessment;Primapara;1st time breastfeeding;Early term 37-38.6wks;Maternal endocrine disorder Age:26 hours   P1 mother whose infant is now 58 hours old.  This is an early term infant at 38+5 weeks.  Mother's current feeding preference is breast.  RN requested assistance.  Baby "Renee Lester" was sleeping when I arrived.  Reviewed breast feeding basics with mother.  Taught hand expression; no drops visible at this time.  Attempted to latch "Renee Lester", however, he was not interested in opening his mouth for latching.  Demonstrated finger feeding.  Encouraged to feed 8-12 times/24 hours or sooner if baby shows cues.  Suggested mother call for latch assistance as needed.  Grandmother at bedside assisting with care.  Mother plans to bring in her own personal pump today.  Advised her that, if she desires to pump, I would recommend using the Symphony pump.  RN set up pump earlier today; mother may need review later today; she prefers to sleep now.     Maternal Data Has patient been taught Hand Expression?: Yes Does the patient have breastfeeding experience prior to this delivery?: No  Feeding Mother's Current Feeding Choice: Breast Milk  LATCH Score Latch: Too sleepy or reluctant, no latch achieved, no sucking elicited.  Audible Swallowing: None  Type of Nipple: Everted at rest and after stimulation  Comfort (Breast/Nipple): Soft / non-tender  Hold (Positioning): Assistance needed to correctly position infant at breast and maintain latch.  LATCH Score: 5   Lactation Tools Discussed/Used Tools: Pump Breast pump type: Double-Electric Breast Pump;Manual Pump Education: Setup, frequency, and cleaning (Set up by RN) Reason for Pumping: Mother's request Pumping frequency: Every three hours or prn  Interventions Interventions: Breast feeding  basics reviewed;Assisted with latch;Skin to skin;Breast massage;Hand express;Position options;Support pillows;Adjust position;DEBP;Education;LC Services brochure  Discharge Pump: Personal Quarry manager)  Consult Status Consult Status: Follow-up Date: 05/27/22 Follow-up type: In-patient    Johnathon Olden R Kadelyn Dimascio 05/26/2022, 5:11 AM

## 2022-05-26 NOTE — Discharge Summary (Signed)
Postpartum Discharge Summary  Date of Service updated 05/27/22      Patient Name: Renee Lester DOB: 1996/03/02 MRN: [id number]  Date of admission: 05/25/2022 Delivery date:05/26/2022  Delivering provider: Holley Bouche  Date of discharge: 05/27/2022  Admitting diagnosis: Indication for care in labor or delivery [O75.9] Intrauterine pregnancy: [redacted]w[redacted]d    Secondary diagnosis:  Principal Problem:   Indication for care in labor or delivery Active Problems:   Polycystic ovary syndrome   Alpha thalassemia silent carrier   Carrier of spinal muscular atrophy   Gestational hypertension  Additional problems: N/a     Discharge diagnosis: Term Pregnancy Delivered and Gestational Hypertension                                              Post partum procedures: n/a  Augmentation: Pitocin and IP Foley Complications: None  Hospital course: Induction of Labor With Vaginal Delivery   26y.o. yo G2P1011 at 36w5das admitted to the hospital 05/25/2022 for induction of labor.  Indication for induction: Gestational hypertension.  Patient had an uncomplicated labor course as follows: Membrane Rupture Time/Date: 1:14 AM ,05/26/2022   Delivery Method:Vaginal, Spontaneous  Episiotomy: None  Lacerations:  1st degree  Details of delivery can be found in separate delivery note.  Patient had a routine postpartum course. Patient is discharged home 05/27/22.  Newborn Data: Birth date:05/26/2022  Birth time:1:39 AM  Gender:Female  Living status:Living  Apgars:8 ,9  Weight:2890 g   Magnesium Sulfate received: No BMZ received: No Rhophylac:No MMR:No T-DaP:Given prenatally Flu: No Transfusion:No  Physical exam  Vitals:   05/26/22 1400 05/26/22 1724 05/26/22 2005 05/27/22 0620  BP: 125/75 113/81 116/72 104/74  Pulse: (!) 106 93 99 97  Resp: '20 20 18 17  ' Temp: 9841.9 (36.9 C) 98 F (36.7 C) 98 F (36.7 C) 98.3 F (36.8 C)  TempSrc: Oral Oral Oral Oral  SpO2:    99%  Weight:      Height:        General: alert, cooperative, and no distress Lochia: appropriate Uterine Fundus: firm Incision: N/A DVT Evaluation: No evidence of DVT seen on physical exam. No significant calf/ankle edema. Labs: Lab Results  Component Value Date   WBC 15.3 (H) 05/26/2022   HGB 10.4 (L) 05/26/2022   HCT 30.6 (L) 05/26/2022   MCV 82.7 05/26/2022   PLT 213 05/26/2022      Latest Ref Rng & Units 05/25/2022    9:05 AM  CMP  Glucose 70 - 99 mg/dL 117   BUN 6 - 20 mg/dL 7   Creatinine 0.44 - 1.00 mg/dL 0.89   Sodium 135 - 145 mmol/L 137   Potassium 3.5 - 5.1 mmol/L 3.5   Chloride 98 - 111 mmol/L 108   CO2 22 - 32 mmol/L 18   Calcium 8.9 - 10.3 mg/dL 9.1   Total Protein 6.5 - 8.1 g/dL 6.4   Total Bilirubin 0.3 - 1.2 mg/dL 1.1   Alkaline Phos 38 - 126 U/L 154   AST 15 - 41 U/L 19   ALT 0 - 44 U/L 13    Edinburgh Score:     No data to display           After visit meds:  Allergies as of 05/27/2022   No Known Allergies      Medication List  STOP taking these medications    aspirin EC 81 MG tablet   ergocalciferol 1.25 MG (50000 UT) capsule Commonly known as: VITAMIN D2   ondansetron 4 MG disintegrating tablet Commonly known as: ZOFRAN-ODT   prenatal multivitamin Tabs tablet       TAKE these medications    furosemide 20 MG tablet Commonly known as: LASIX Take 1 tablet (20 mg total) by mouth daily. Start taking on: May 28, 2022   ibuprofen 600 MG tablet Commonly known as: ADVIL Take 1 tablet (600 mg total) by mouth every 6 (six) hours.         Discharge home in stable condition Infant Feeding: Bottle Infant Disposition:home with mother Discharge instruction: per After Visit Summary and Postpartum booklet. Activity: Advance as tolerated. Pelvic rest for 6 weeks.  Diet: routine diet Future Appointments: Future Appointments  Date Time Provider [place]  06/02/2022  9:00 AM [facility] NURSE [facility]  07/05/2022  2:55 PM Gabriel Carina, CNM  [facility]   Follow up Visit:  Message sent to [facility] by Dr Higinio Plan:  Please schedule this patient for a In person postpartum visit in 6 weeks with the following provider: Any provider. Additional Postpartum F/U:BP check 1 week  High risk pregnancy complicated by: HTN Delivery mode:  Vaginal, Spontaneous  Anticipated Birth Control:   none   05/27/2022 Jacquiline Doe, CNM

## 2022-05-26 NOTE — Anesthesia Postprocedure Evaluation (Signed)
Anesthesia Post Note  Patient: Renee Lester  Procedure(s) Performed: AN AD HOC LABOR EPIDURAL     Patient location during evaluation: Mother Baby Anesthesia Type: Epidural Level of consciousness: awake Pain management: satisfactory to patient Vital Signs Assessment: post-procedure vital signs reviewed and stable Respiratory status: spontaneous breathing Cardiovascular status: stable Anesthetic complications: no   No notable events documented.  Last Vitals:  Vitals:   05/26/22 0800 05/26/22 1037  BP: 126/84 132/83  Pulse: 98 99  Resp: 18 20  Temp: 37.1 C 36.9 C  SpO2: 100%     Last Pain:  Vitals:   05/26/22 1053  TempSrc:   PainSc: 0-No pain   Pain Goal:                   [employer]

## 2022-05-26 NOTE — Anesthesia Procedure Notes (Addendum)
Epidural Patient location during procedure: OB Start time: 05/25/2022 11:43 AM End time: 05/26/2022 12:09 AM  Staffing Anesthesiologist: Lewie Loron, MD Performed: anesthesiologist   Preanesthetic Checklist Completed: patient identified, IV checked, risks and benefits discussed, monitors and equipment checked, pre-op evaluation and timeout performed  Epidural Patient position: sitting Prep: DuraPrep and site prepped and draped Patient monitoring: heart rate, continuous pulse ox and blood pressure Approach: midline Location: L3-L4 Injection technique: LOR air and LOR saline  Needle:  Needle type: Tuohy  Needle gauge: 17 G Needle length: 9 cm Needle insertion depth: 9 cm Catheter type: closed end flexible Catheter size: 19 Gauge Catheter at skin depth: 16 cm Test dose: negative  Assessment Sensory level: T8 Events: blood not aspirated, injection not painful, no injection resistance, no paresthesia and negative IV test  Additional Notes Reason for block:procedure for pain

## 2022-05-26 NOTE — Lactation Note (Addendum)
This note was copied from a baby's chart. Lactation Consultation Note  Patient Name: Renee Lester ELYHT'M Date: 05/26/2022 Reason for consult: Follow-up assessment;Mother's request;Difficult latch;Early term 37-38.6wks;Breastfeeding assistance Age:26 hours PCOS  Infant recent bath sleeping in Mom's arms. LC asked Mom like to try latching while infant STS, she declined. Mom states trying to offer him breast or bottle but he has been sleepy.  Mom to call for latch assistance with next feeding. RN, Alfonzo Feller, aware of findings above.   Mom encouraged to post pump after feeding for 15 mins on initial setting.   Plan 1. To feed based on cues 8-12x 24hr period. Mom to offer breasts and look for signs of milk transfer.  2. Mom to supplement with EBM first then formula with pace bottle feeding and slow flow nipple.  3. Post pump after each feeding as stated above.  All questions answered at the end of the visit.  On arrival, RN Alfonzo Feller assisted feeding infant 8 ml of formula. Infant resting comfortably.  LC got mom pumping with DEBP.  Mom to call for latch assistance with next feeding.   Maternal Data    Feeding Mother's Current Feeding Choice: Breast Milk and Formula  LATCH Score                    Lactation Tools Discussed/Used Tools: Pump;Flanges Flange Size: 24 Breast pump type: Double-Electric Breast Pump Pump Education: Setup, frequency, and cleaning;Milk Storage Reason for Pumping: increase stimulation Pumping frequency: post pump after feeding for 15 mins  Interventions Interventions: Breast feeding basics reviewed;Hand express;Expressed milk;DEBP;Education;Infant Driven Feeding Algorithm education  Discharge Pump: Personal;DEBP WIC Program: No (Mom WIC appt next week)  Consult Status Consult Status: Follow-up Date: 05/27/22 Follow-up type: In-patient    Jasten Guyette  Nicholson-Springer 05/26/2022, 2:16 PM

## 2022-05-27 MED ORDER — FUROSEMIDE 20 MG PO TABS: 20.0000 mg | ORAL_TABLET | Freq: Every day | ORAL | 0 refills | Status: DC

## 2022-05-27 MED ORDER — IBUPROFEN 600 MG PO TABS: 600.0000 mg | ORAL_TABLET | Freq: Four times a day (QID) | ORAL | 0 refills | Status: DC

## 2022-05-27 NOTE — Progress Notes (Signed)
Post Partum Day 1 Subjective: no complaints, up ad lib, and voiding  Objective: Blood pressure 104/74, pulse 97, temperature 98.3 F (36.8 C), temperature source Oral, resp. rate 17, height 5\' 8"  (1.727 m), weight (!) 136.4 kg, last menstrual period 08/28/2021, SpO2 99 %, unknown if currently breastfeeding.  Physical Exam:  General: alert, cooperative, and appears stated age 26: appropriate Uterine Fundus: firm DVT Evaluation: No evidence of DVT seen on physical exam.  Recent Labs    05/25/22 1933 05/26/22 0307  HGB 11.3* 10.4*  HCT 33.1* 30.6*    Assessment/Plan: Plan for discharge tomorrow, Breastfeeding, Circumcision prior to discharge, and Contraception undecided  Deciding about Circumcision in Baby Boys  (The Basics)  What is circumcision?   Circumcision is a surgery that removes the skin that covers the tip of the penis, called the "foreskin" Circumcision is usually done when a boy is between 67 and 54 days old. In the 5, circumcision is common. In some other countries, fewer boys are circumcised. Circumcision is a common tradition in some religions.  Should I have my baby boy circumcised?   There is no easy answer. Circumcision has some benefits. But it also has risks. After talking with your doctor, you will have to decide for yourself what is right for your family.  What are the benefits of circumcision?   Circumcised boys seem to have slightly lower rates of: ?Urinary tract infections ?Swelling of the opening at the tip of the penis Circumcised men seem to have slightly lower rates of: ?Urinary tract infections ?Swelling of the opening at the tip of the penis ?Penis cancer ?HIV and other infections that you catch during sex ?Cervical cancer in the women they have sex with Even so, in the [place], the risks of these problems are small - even in boys and men who have not been circumcised. Plus, boys and men who are not circumcised can reduce  these extra risks by: ?Cleaning their penis well ?Using condoms during sex  What are the risks of circumcision?  Risks include: ?Bleeding or infection from the surgery ?Damage to or amputation of the penis ?A chance that the doctor will cut off too much or not enough of the foreskin ?A chance that sex won't feel as good later in life Only about 1 out of every 200 circumcisions leads to problems. There is also a chance that your health insurance won't pay for circumcision.  How is circumcision done in baby boys?  First, the baby gets medicine for pain relief. This might be a cream on the skin or a shot into the base of the penis. Next, the doctor cleans the baby's penis well. Then he or she uses special tools to cut off the foreskin. Finally, the doctor wraps a bandage (called gauze) around the baby's penis. If you have your baby circumcised, his doctor or nurse will give you instructions on how to care for him after the surgery. It is important that you follow those instructions carefully.   LOS: 2 days   [place], MD 05/27/2022, 8:49 AM

## 2022-05-27 NOTE — Progress Notes (Signed)
PostPartum Day 1 Subjective: Just getting full feeling/function back in her legs, left leg is still fairly numb. She was 8hrs pp and this was her first time up without the steady to assist. Has only voided very small amounts twice, has not been having urge to void yet.   Objective: Blood pressure 104/74, pulse 97, temperature 98.3 F (36.8 C), temperature source Oral, resp. rate 17, height 5\' 8"  (1.727 m), weight (!) 300 lb 9.6 oz (136.4 kg), last menstrual period 08/28/2021, SpO2 99 %, unknown if currently breastfeeding.  Physical Exam:  General: alert, cooperative, appears stated age, and no distress Lochia: appropriate Uterine Fundus: firm Incision: N/A DVT Evaluation: No evidence of DVT seen on physical exam.  Bladder scan after 2nd attempt to void: 10/28/2021  Recent Labs    05/25/22 1933 05/26/22 0307  HGB 11.3* 10.4*  HCT 33.1* 30.6*    Assessment/Plan: Explained to patient that it takes >339ml to have the urge so she should keep drinking fluids and attempt to void at first urge, likely she will be more successful as she regains more feeling/function in her lower extremities. RN at bedside during discussion, will notify if pt remains unable to void.   LOS: 2 days   45m, CNM, MSN, [facility] Certified Nurse Midwife, [facility]

## 2022-05-27 NOTE — Lactation Note (Signed)
This note was copied from a baby's chart. Lactation Consultation Note  Patient Name: Renee Lester [id number]'[id number] Date: 05/27/2022 Reason for consult: Follow-up assessment;1st time breastfeeding;Early term 37-38.6wks Age:26 hours Per mom, she decided to " pump only" and formula feed infant , she no longer wants to latch infant at breast. Per mom, she pumped twice but did not see colostrum. LC discussed importance of pumping every 3 hours for 15 minutes on initial setting to help stimulate and establish her  milk supply since she is not latching infant at the breast. Mom knows how to hand express already and  mom declined a review  for hand expression with LC. Per mom, she has Mom's [place] at home. Mom knows breast milk is good at room temperature whereas formula must be used within 1 hour. Mom will continue to feed infant by cues, 8 to 12+ times within 24 hours. LC discussed discharge education look below at discharge. Mom made aware of O/P services, breastfeeding support groups, community resources, and our phone # for post-discharge questions.   Maternal Data    Feeding Mother's Current Feeding Choice: Formula  LATCH Score                    Lactation Tools Discussed/Used    Interventions    Discharge Discharge Education: Engorgement and breast care;Warning signs for feeding baby Pump: Personal (Per mom, she has Mom [employer] DEBP at home.)  Consult Status Consult Status: Complete Date: 05/27/22 Follow-up type: Physician    Danelle Earthly 05/27/2022, 6:30 PM

## 2022-05-28 LAB — CULTURE, BETA STREP (GROUP B ONLY): Strep Gp B Culture: NEGATIVE

## 2022-05-29 ENCOUNTER — Other Ambulatory Visit: Payer: Self-pay

## 2022-06-01 ENCOUNTER — Ambulatory Visit (INDEPENDENT_AMBULATORY_CARE_PROVIDER_SITE_OTHER): Payer: BC Managed Care – PPO | Admitting: Family Medicine

## 2022-06-01 ENCOUNTER — Encounter: Payer: Self-pay | Admitting: Student

## 2022-06-01 ENCOUNTER — Encounter: Payer: Self-pay | Admitting: Family Medicine

## 2022-06-01 ENCOUNTER — Other Ambulatory Visit: Payer: Self-pay

## 2022-06-01 ENCOUNTER — Other Ambulatory Visit: Payer: BC Managed Care – PPO

## 2022-06-01 VITALS — BP 127/87

## 2022-06-01 DIAGNOSIS — N6459 Other signs and symptoms in breast: Secondary | ICD-10-CM | POA: Diagnosis not present

## 2022-06-01 MED ORDER — FUROSEMIDE 20 MG PO TABS
20.0000 mg | ORAL_TABLET | Freq: Two times a day (BID) | ORAL | 0 refills | Status: DC
Start: 2022-06-01 — End: 2022-07-05

## 2022-06-01 NOTE — Progress Notes (Signed)
    Subjective:    Patient ID: Renee Lester is a 26 y.o. female presenting with No chief complaint on file.  on 06/01/2022  HPI: Here with nursing staff for BP check. Noted to have mass in right breast x 1 day. It is sore. She has been bottle feeding and pumped today x 2 and yesterday x 1.  Review of Systems  Constitutional:  Negative for chills and fever.  Respiratory:  Negative for shortness of breath.   Cardiovascular:  Negative for chest pain.  Gastrointestinal:  Negative for abdominal pain, nausea and vomiting.  Genitourinary:  Negative for dysuria.  Skin:  Negative for rash.      Objective:    BP 127/87 (BP Location: Left Arm, Patient Position: Sitting, Cuff Size: Large)   LMP 08/28/2021  Physical Exam Exam conducted with a chaperone present.  Constitutional:      General: She is not in acute distress.    Appearance: She is well-developed.  HENT:     Head: Normocephalic and atraumatic.  Eyes:     General: No scleral icterus. Cardiovascular:     Rate and Rhythm: Normal rate.  Pulmonary:     Effort: Pulmonary effort is normal.  Chest:    Abdominal:     Palpations: Abdomen is soft.  Musculoskeletal:     Cervical back: Neck supple.  Skin:    General: Skin is warm and dry.  Neurological:     Mental Status: She is alert and oriented to person, place, and time.         Assessment & Plan:   Engorgement of breast - warm compresses, pump frequently  Return if symptoms worsen or fail to improve.  Reva Bores, MD 06/01/2022 11:25 PM

## 2022-06-01 NOTE — Progress Notes (Signed)
Seen while patient here for BP check due to lump in breast that began in the last 2 days.   Patient reports she did not BF or pump for 3 days and began pumping 2 days ago. She pumps 1-2 times a day and is getting 2 ounces per pumping.   Area noted to outer upper right breast. Area is size of baseball with irregular borders. No redness noted, tender to touch. Reviewed engorgement treatment/prevention by using ice packs for 10-15 minutes prior to pumping.   Reviewed supply and demand and importance of emptying the breast 8-12 times a day to protect milk supply if she would like to make a full milk supply. Reviewed if does not plan to breast feed, area should resolve on its own. Advised patient that if swelling continues or develops a fever or flu like symptoms, please let the office know. Patient voiced understanding.

## 2022-06-01 NOTE — Progress Notes (Signed)
Blood Pressure Check Visit Note  Patient: [name] MRN: [id number] Date of Birth: Feb 03, 1996 PCP: Littie Deeds, MD   [name] 26 y.o. female presents for a postpartum blood pressure check visit today. She is complaining of bilateral lower extremity edema and small "lump" on right breast. Patient is currently breast-feeding. She delivered on 7/72023 and is to complete her furosemide 20mg  daily today (06/01/2022).  BP 127/87 (BP Location: Left Arm, Patient Position: Sitting, Cuff Size: Large)   LMP 08/28/2021    Assessment and Plan: Patient's blood pressure today is normal at 127/87 mmHg. Since she does have swelling, spoke with Dr. 10/28/2021 and will prescribe patient furosemide 20 mg BID x5 additional days. Patient counseled on medication and to continue monitoring symptoms/blood pressure at home. Dr. Shawnie Pons to see patient due to concern with lump on breast.  Thank you for allowing pharmacy to be a part of this patient's care.  Shawnie Pons, PharmD, BCPPS

## 2022-06-02 ENCOUNTER — Ambulatory Visit: Payer: BC Managed Care – PPO

## 2022-06-04 ENCOUNTER — Inpatient Hospital Stay (HOSPITAL_COMMUNITY): Admission: AD | Admit: 2022-06-04 | Payer: BC Managed Care – PPO | Source: Home / Self Care | Admitting: Family Medicine

## 2022-06-04 ENCOUNTER — Inpatient Hospital Stay (HOSPITAL_COMMUNITY): Payer: BC Managed Care – PPO

## 2022-06-08 ENCOUNTER — Other Ambulatory Visit: Payer: Self-pay

## 2022-06-08 ENCOUNTER — Encounter: Payer: Self-pay | Admitting: Obstetrics and Gynecology

## 2022-07-05 ENCOUNTER — Encounter: Payer: Self-pay | Admitting: Certified Nurse Midwife

## 2022-07-05 ENCOUNTER — Ambulatory Visit (INDEPENDENT_AMBULATORY_CARE_PROVIDER_SITE_OTHER): Payer: BC Managed Care – PPO | Admitting: Certified Nurse Midwife

## 2022-07-05 DIAGNOSIS — Z3009 Encounter for other general counseling and advice on contraception: Secondary | ICD-10-CM

## 2022-07-05 NOTE — Progress Notes (Signed)
Post Partum Visit Note  Renee Lester is a 26 y.o. G12P1011 female who presents for a postpartum visit. She is 5 weeks postpartum following a normal spontaneous vaginal delivery.  I have fully reviewed the prenatal and intrapartum course. The delivery was at [redacted]w[redacted]d.  Anesthesia: epidural. Postpartum course has been uncomplicated. Baby is doing well. Baby is feeding by bottle - Similac Advance. Bleeding no bleeding. Bowel function is normal. Bladder function is normal. Patient is not sexually active. Contraception method is abstinence. Postpartum depression screening: negative.   Upstream - 07/05/22 2011       Pregnancy Intention Screening   Does the patient want to become pregnant in the next year? No    Does the patient's partner want to become pregnant in the next year? No    Would the patient like to discuss contraceptive options today? No      Contraception Wrap Up   Current Method Abstinence    End Method Female Condom    Contraception Counseling Provided Yes            The pregnancy intention screening data noted above was reviewed. Potential methods of contraception were discussed. The patient elected to proceed with Female Condom.   Edinburgh Postnatal Depression Scale - 07/05/22 1518       Edinburgh Postnatal Depression Scale:  In the Past 7 Days   I have been able to laugh and see the funny side of things. 0    I have looked forward with enjoyment to things. 0    I have blamed myself unnecessarily when things went wrong. 0    I have been anxious or worried for no good reason. 0    I have felt scared or panicky for no good reason. 0    Things have been getting on top of me. 0    I have been so unhappy that I have had difficulty sleeping. 0    I have felt sad or miserable. 0    I have been so unhappy that I have been crying. 0    The thought of harming myself has occurred to me. 0    Edinburgh Postnatal Depression Scale Total 0            Health Maintenance Due   Topic Date Due   INFLUENZA VACCINE  06/20/2022   The following portions of the patient's history were reviewed and updated as appropriate: allergies, current medications, past family history, past medical history, past social history, past surgical history, and problem list.  Review of Systems Pertinent items noted in HPI and remainder of comprehensive ROS otherwise negative.  Objective:  BP 134/88   Pulse 93   LMP 08/28/2021    Constitutional: Alert, oriented female in no physical distress.  HEENT: PERRLA Skin: normal color and turgor, no rash Cardiovascular: normal rate & rhythm Respiratory: normal effort, no problems with respiration noted GI: Abd soft, non-tender MS: Extremities nontender, no edema, normal ROM Neurologic: Alert and oriented x 4.  GU: no CVA tenderness Pelvic: deferred Breasts: normal lactating breasts, no edema or signs of nipple damage  Assessment:    1. Postpartum care and examination - Doing well, no problems  2. Birth control counseling - Plans to use condoms when she resumes intercourse  Plan:   Essential components of care per ACOG recommendations:  1.  Mood and well being: Patient with negative depression screening today. Reviewed local resources for support.  - Patient tobacco use? No.   -  hx of drug use? No.    2. Infant care and feeding:  -Patient currently breastmilk feeding? No.  -Social determinants of health (SDOH) reviewed in EPIC. No concerns  3. Sexuality, contraception and birth spacing - Patient does not want a pregnancy in the next year.  Desired family size is currently unknown.  - Reviewed reproductive life planning. Reviewed contraceptive methods based on pt preferences and effectiveness.  Patient desired Female Condom today.   - Discussed birth spacing of 18 months  4. Sleep and fatigue -Encouraged family/partner/community support of 4 hrs of uninterrupted sleep to help with mood and fatigue  5. Physical Recovery  -  Discussed patients delivery and complications. She describes her labor as good. - Patient had a Vaginal, no problems at delivery. Patient had a 1st degree laceration. Perineal healing reviewed. Patient expressed understanding - Patient has urinary incontinence? No. - Patient is safe to resume physical and sexual activity  6.  Health Maintenance - HM due items addressed Yes - Last pap smear  Diagnosis  Date Value Ref Range Status  10/21/2019   Final   - Negative for intraepithelial lesion or malignancy (NILM)   Pap smear not done at today's visit.  -Breast Cancer screening indicated? No.   7. Chronic Disease/Pregnancy Condition follow up: None - PCP follow up as needed  Bernerd Limbo, CNM [facility] [employer], [facility]

## 2022-07-17 ENCOUNTER — Ambulatory Visit (INDEPENDENT_AMBULATORY_CARE_PROVIDER_SITE_OTHER): Payer: BC Managed Care – PPO | Admitting: Student

## 2022-07-17 ENCOUNTER — Encounter: Payer: Self-pay | Admitting: Student

## 2022-07-17 VITALS — BP 118/74 | HR 80 | Wt 283.0 lb

## 2022-07-17 DIAGNOSIS — N760 Acute vaginitis: Secondary | ICD-10-CM | POA: Diagnosis not present

## 2022-07-17 DIAGNOSIS — R399 Unspecified symptoms and signs involving the genitourinary system: Secondary | ICD-10-CM

## 2022-07-17 DIAGNOSIS — B9689 Other specified bacterial agents as the cause of diseases classified elsewhere: Secondary | ICD-10-CM

## 2022-07-17 DIAGNOSIS — N898 Other specified noninflammatory disorders of vagina: Secondary | ICD-10-CM

## 2022-07-17 LAB — POCT URINALYSIS DIP (MANUAL ENTRY)
Glucose, UA: NEGATIVE mg/dL
Leukocytes, UA: NEGATIVE
Nitrite, UA: POSITIVE — AB
Spec Grav, UA: 1.02 (ref 1.010–1.025)
Urobilinogen, UA: 1 E.U./dL
pH, UA: 5.5 (ref 5.0–8.0)

## 2022-07-17 LAB — POCT WET PREP (WET MOUNT)
Clue Cells Wet Prep Whiff POC: POSITIVE
Trichomonas Wet Prep HPF POC: ABSENT

## 2022-07-17 MED ORDER — METRONIDAZOLE 500 MG PO TABS: 500.0000 mg | ORAL_TABLET | Freq: Two times a day (BID) | ORAL | 0 refills | Status: AC

## 2022-07-17 NOTE — Progress Notes (Signed)
    SUBJECTIVE:   CHIEF COMPLAINT / HPI:   Vaginal DischargeUTI Sxs Patient is a 26 y.o. female presenting with vaginal irritation for 5 days.  She states she has had no new discharge.  She endorses different vaginal odor.  She is not interested in screening for sexually transmitted infections today. She is around ~7 weeks postpartum and just started her first period.  She denies any pain when she is urinating but does feel a tingling sensation.  She says that this feels like UTIs that she has gotten in the past and would like to get her urine tested.  She has some increased frequency.  Currently bleeding from period.  PERTINENT  PMH / PSH: None relevant  OBJECTIVE:   BP 118/74   Pulse 80   Wt 283 lb (128.4 kg)   LMP 07/17/2022   Breastfeeding No   BMI 43.03 kg/m    General: NAD, pleasant, able to participate in exam Respiratory: Normal effort, no obvious respiratory distress Pelvic: VULVA: normal appearing vulva with no masses, tenderness or lesions, VAGINA: Normal appearing vagina with normal color, no lesions, with bloody discharge present, CERVIX: No lesions, bloody discharge present  Chaperone Jazmin Hartsell CMA present for pelvic exam  ASSESSMENT/PLAN:   Assessment:  26 y.o. female with vaginal irritation for 5 days, as well as odor.  Physical exam significant for bloody discharge.  Wet prep performed today shows clue cells and positive whiff consistent with BV.  Patient is not interested in STI screening.   Plan: -Wet prep as above.  Will treat with metronidazole 500 mg BID for 7 days. -UA (positive nitrite) > UCx -Follow-up as needed  Levin Erp, MD [facility]

## 2022-07-17 NOTE — Patient Instructions (Signed)
It was great to see you! Thank you for allowing me to participate in your care!   Our plans for today:  - We will check your urine - I will let you know your wet prep results  Take care and seek immediate care sooner if you develop any concerns.  Levin Erp, MD

## 2022-07-19 ENCOUNTER — Telehealth: Payer: Self-pay | Admitting: Student

## 2022-07-19 ENCOUNTER — Other Ambulatory Visit: Payer: Self-pay | Admitting: Student

## 2022-07-19 DIAGNOSIS — R399 Unspecified symptoms and signs involving the genitourinary system: Secondary | ICD-10-CM

## 2022-07-19 LAB — URINE CULTURE

## 2022-07-19 MED ORDER — CEPHALEXIN 500 MG PO CAPS: 500.0000 mg | ORAL_CAPSULE | Freq: Two times a day (BID) | ORAL | 0 refills | Status: AC

## 2022-07-19 NOTE — Progress Notes (Signed)
Keflex

## 2022-07-19 NOTE — Telephone Encounter (Signed)
Called patient and confirmed date of birth on phone.  Discussed that urine culture showed greater than 100,000 colonies of E. coli bacteria.  Patient having urinary frequency still.  No pyelonephritis at this time.  Discussed treatment today versus waiting for sensitivities tomorrow and patient would like to have antibiotic treatment today.  Will send in Keflex twice daily course for 7 days.

## 2022-08-14 ENCOUNTER — Encounter: Payer: Self-pay | Admitting: *Deleted

## 2022-08-21 NOTE — Progress Notes (Deleted)
    SUBJECTIVE:   CHIEF COMPLAINT / HPI:  No chief complaint on file.   ***  PERTINENT  PMH / PSH: PCOS  Patient Care Team: Zola Button, MD as PCP - General (Family Medicine) Renee Harder, CNM as PCP - OBGYN (Obstetrics and Gynecology)   OBJECTIVE:   There were no vitals taken for this visit.  Physical Exam      07/17/2022    3:34 PM  Depression screen PHQ 2/9  Decreased Interest 0  Down, Depressed, Hopeless 0  PHQ - 2 Score 0  Altered sleeping 0  Tired, decreased energy 0  Change in appetite 0  Feeling bad or failure about yourself  0  Trouble concentrating 0  Moving slowly or fidgety/restless 0  Suicidal thoughts 0  PHQ-9 Score 0     {Show previous vital signs (optional):23777}  {Labs  Heme  Chem  Endocrine  Serology  Results Review (optional):23779}  ASSESSMENT/PLAN:   No problem-specific Assessment & Plan notes found for this encounter.    No follow-ups on file.   Zola Button, MD [place]

## 2022-08-22 ENCOUNTER — Ambulatory Visit: Payer: BC Managed Care – PPO | Admitting: Family Medicine

## 2022-09-01 ENCOUNTER — Other Ambulatory Visit (HOSPITAL_COMMUNITY)
Admission: RE | Admit: 2022-09-01 | Discharge: 2022-09-01 | Disposition: A | Discharge status: Home / Self Care | Payer: BC Managed Care – PPO | Source: Ambulatory Visit | Attending: Family Medicine | Admitting: Family Medicine

## 2022-09-01 ENCOUNTER — Encounter: Payer: Self-pay | Admitting: Student

## 2022-09-01 ENCOUNTER — Ambulatory Visit (INDEPENDENT_AMBULATORY_CARE_PROVIDER_SITE_OTHER): Payer: BC Managed Care – PPO | Admitting: Student

## 2022-09-01 VITALS — BP 133/86 | HR 96 | Ht 68.0 in | Wt 281.2 lb

## 2022-09-01 DIAGNOSIS — R350 Frequency of micturition: Secondary | ICD-10-CM

## 2022-09-01 DIAGNOSIS — Z113 Encounter for screening for infections with a predominantly sexual mode of transmission: Secondary | ICD-10-CM | POA: Diagnosis present

## 2022-09-01 LAB — POCT WET PREP (WET MOUNT)
Clue Cells Wet Prep Whiff POC: NEGATIVE
Trichomonas Wet Prep HPF POC: ABSENT

## 2022-09-01 LAB — POCT URINALYSIS DIP (MANUAL ENTRY)
Blood, UA: NEGATIVE
Glucose, UA: NEGATIVE mg/dL
Ketones, POC UA: NEGATIVE mg/dL
Leukocytes, UA: NEGATIVE
Nitrite, UA: NEGATIVE
Spec Grav, UA: >=1.03 — AB (ref 1.010–1.025)
Urobilinogen, UA: 1 E.U./dL
pH, UA: 5.5 (ref 5.0–8.0)

## 2022-09-01 LAB — POCT URINE PREGNANCY: Preg Test, Ur: NEGATIVE

## 2022-09-01 NOTE — Patient Instructions (Signed)
It was great seeing you today. We are collecting labs today.  I will let know if anything is abnormal.   If you have any questions or concerns, please feel free to call the clinic.    Be well,  Dr. Orvis Brill [facility] [phone]

## 2022-09-01 NOTE — Progress Notes (Signed)
    SUBJECTIVE:   CHIEF COMPLAINT / HPI:   Vaginal Discharge: Patient is a 26 y.o. female presenting for STI screening.  She denies any symptoms including vaginal irritation, discharge, itching.  LMP was 08/12/2022.  She is not currently using any contraceptives, and she does have a 63-month-old at home.  PERTINENT  PMH / PSH: None relevant  OBJECTIVE:   BP 133/86   Pulse 96   Ht 5\' 8"  (1.727 m)   Wt 281 lb 3.2 oz (127.6 kg)   LMP 08/12/2022   SpO2 99%   BMI 42.76 kg/m    General: NAD, pleasant, able to participate in exam, obese Respiratory: Normal effort, no obvious respiratory distress Pelvic: VULVA: normal appearing vulva with no masses, tenderness or lesions, VAGINA: Normal appearing vagina with normal color, no lesions, with white discharge present, CERVIX: No lesions, moderate amount of white discharge present.  No cervical friability.   Chaperone Judeen Hammans, CMA present for pelvic exam  ASSESSMENT/PLAN:   Assessment:  26 y.o. female with desire for STI screening.  Physical exam significant for moderate amount of white discharge. Point-of-care urinalysis dipstick without sign of infection.  Urine pregnancy negative. At follow-up, continue conversation regarding contraceptives. Plan: -GC/chlamydia, trichomonas pending -Will check HIV and RPR  Orvis Brill, [place]

## 2022-09-02 LAB — HIV ANTIBODY (ROUTINE TESTING W REFLEX): HIV Screen 4th Generation wRfx: NONREACTIVE

## 2022-09-02 LAB — RPR: RPR Ser Ql: NONREACTIVE

## 2022-09-05 LAB — CERVICOVAGINAL ANCILLARY ONLY
Chlamydia: NEGATIVE
Comment: NEGATIVE
Comment: NORMAL
Neisseria Gonorrhea: NEGATIVE

## 2022-11-16 IMAGING — US US MFM OB FOLLOW-UP
1 series · 13 of 28 positions shown · non-contrast
Comparison: none

[Series 1: us mfm ob follow-up · 86 acquisitions, 13 frames shown]
[im 4/86]
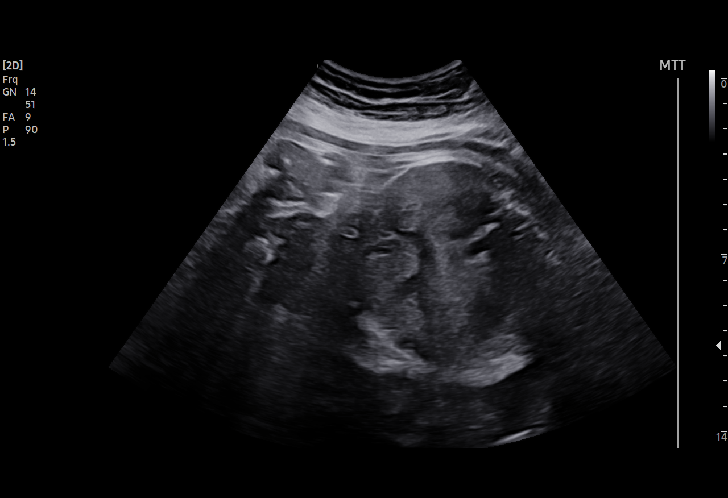
[im 10/86]
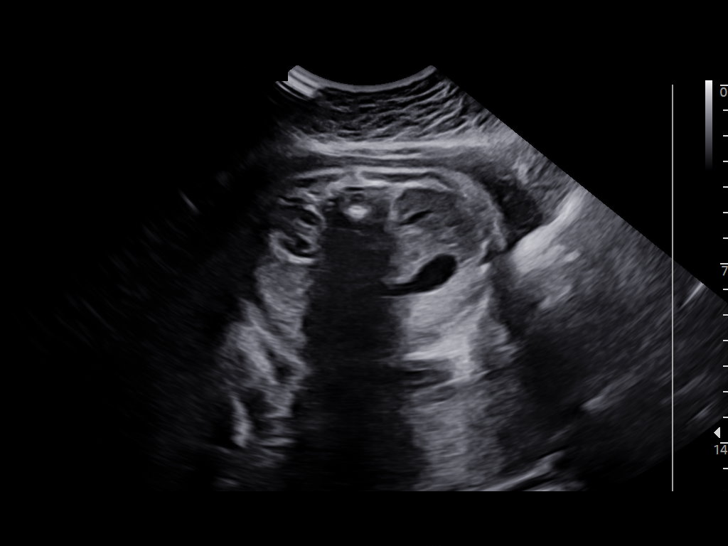
[im 16/86]
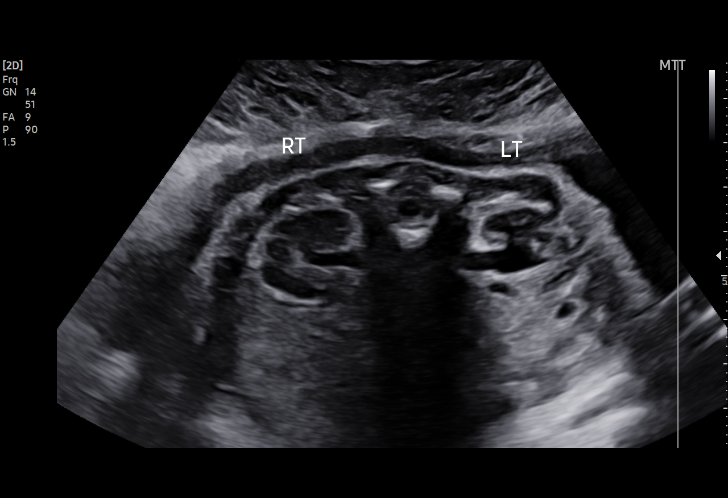
[im 23/86]
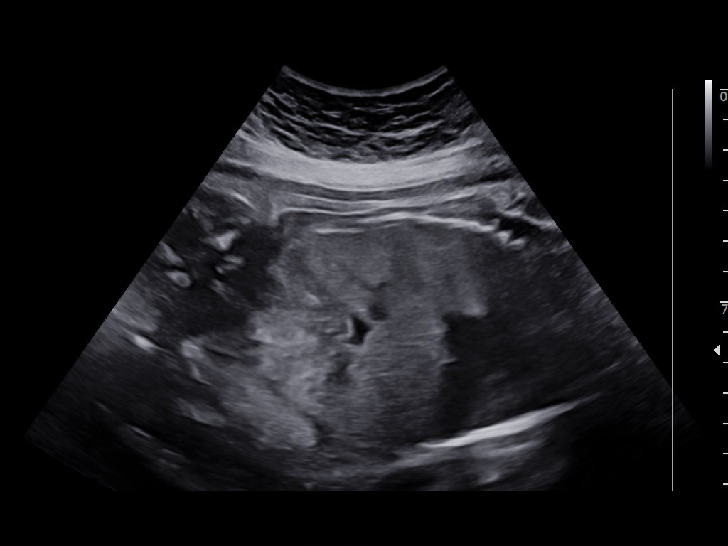
[im 29/86]
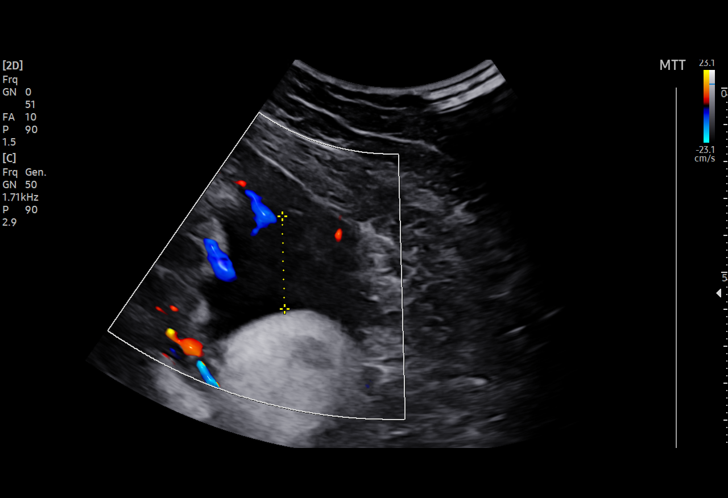
[im 35/86]
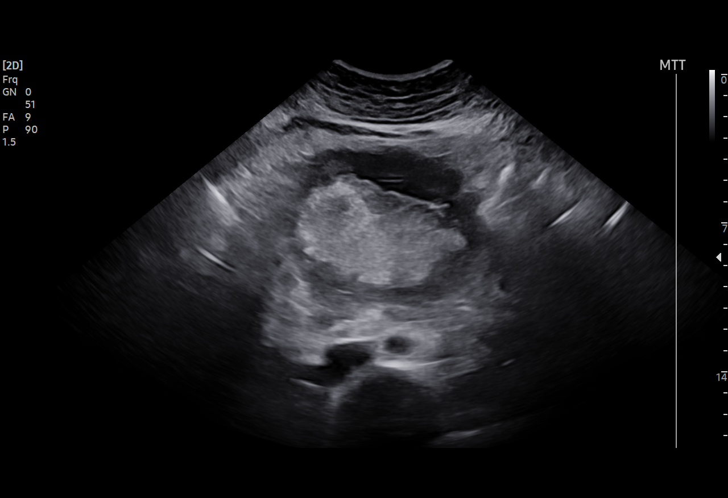
[im 45/86]
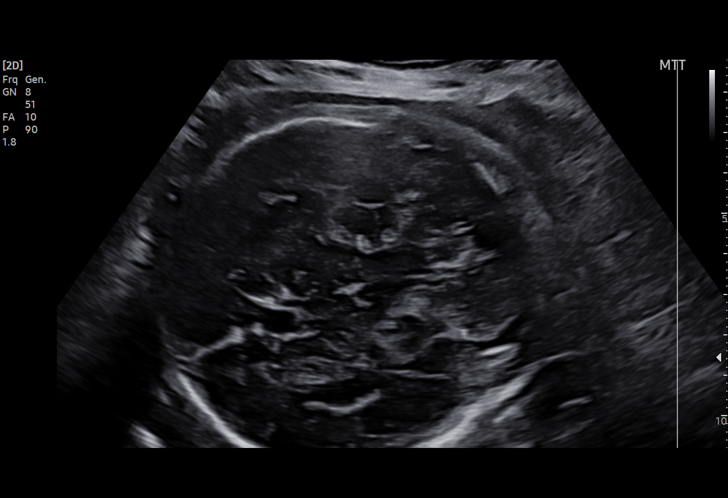
[im 51/86]
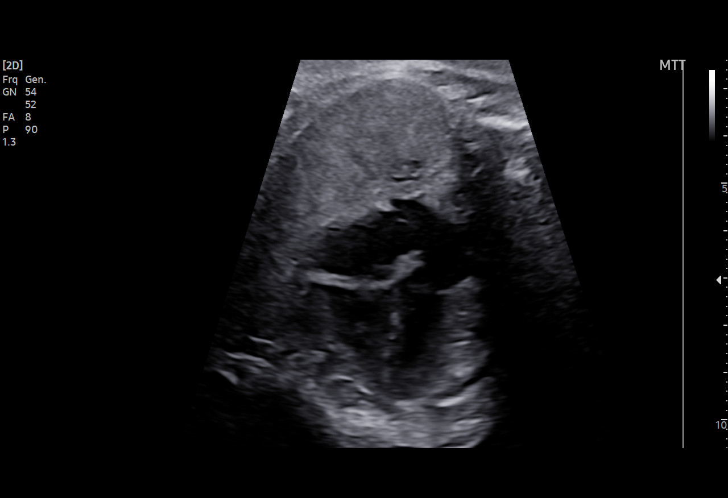
[im 57/86]
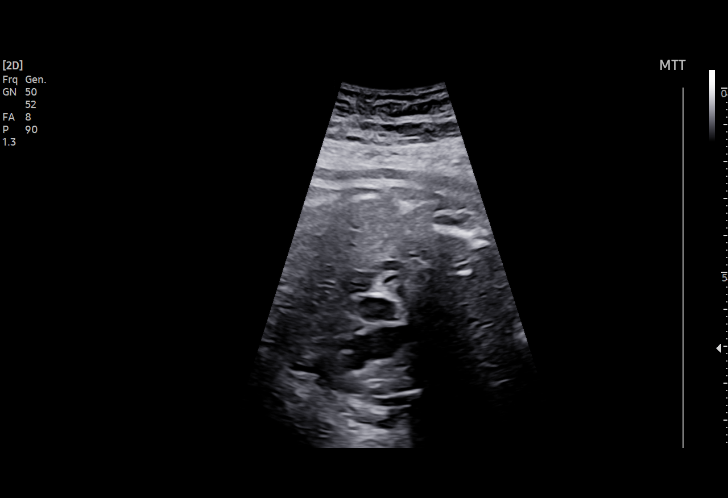
[im 63/86]
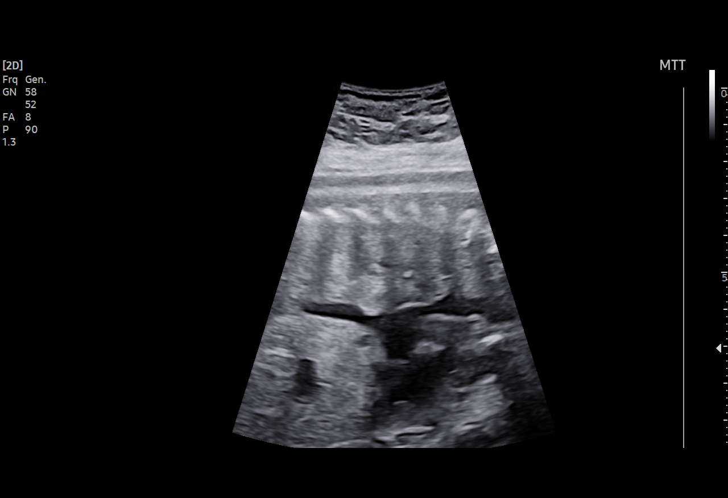
[im 70/86]
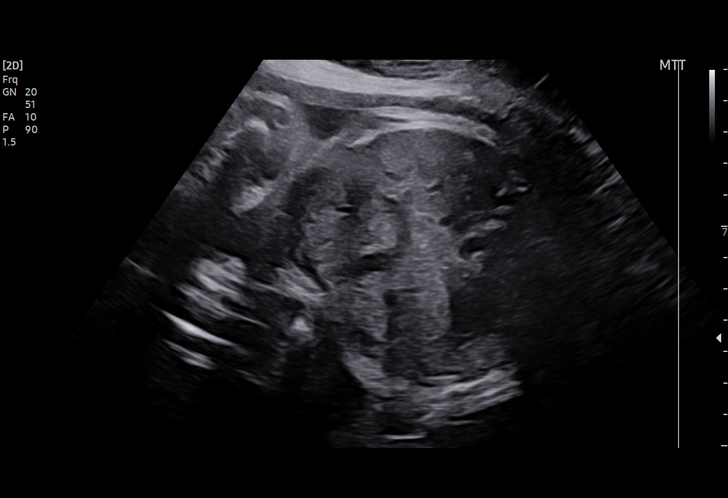
[im 76/86]
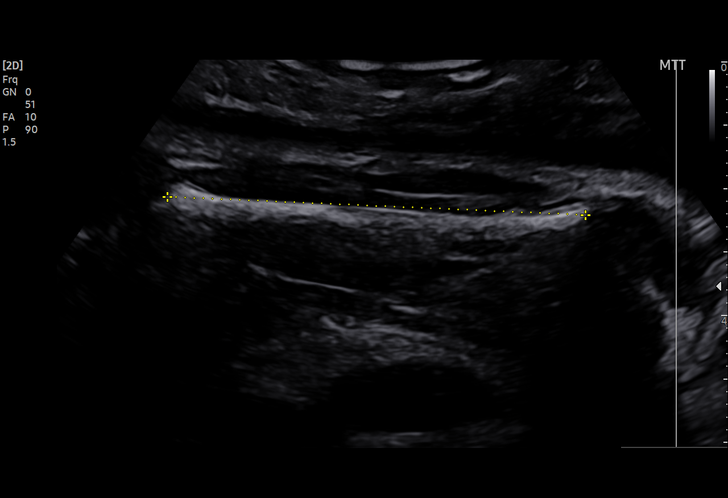
[im 82/86]
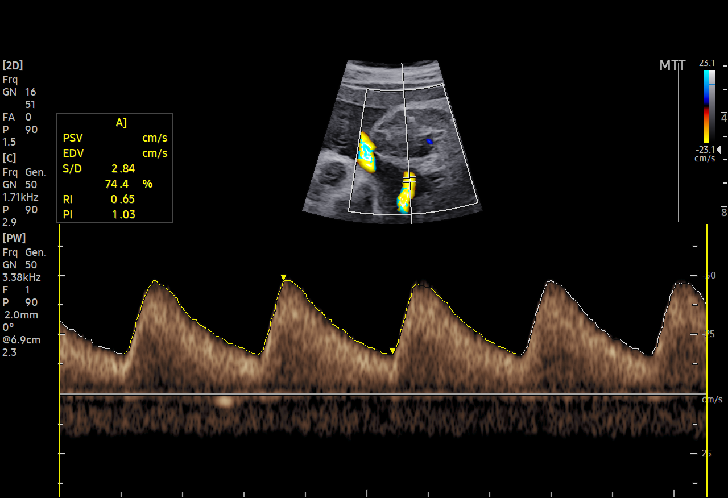

[13 of 28 positions shown; findings below may reference images not displayed]

[facility]                                  [HOSPITAL] at

Indications

 Obesity complicating pregnancy, third
 trimester (BMI 45)
 35 weeks gestation of pregnancy
 Genetic carrier (increased carrier risk for
 SMA)
 Genetic carrier (silent carrier alpha
 thalassemia)
 LR NIPS/Negative AFP
 Encounter for other antenatal screening
 follow-up
Fetal Evaluation

 Num Of Fetuses:         1
 Fetal Heart Rate(bpm):  137
 Cardiac Activity:       Observed
 Presentation:           Cephalic
 Placenta:               Posterior
 P. Cord Insertion:      Previously Visualized

 Amniotic Fluid
 AFI FV:      Within normal limits

 AFI Sum(cm)     %Tile       Largest Pocket(cm)
 12.54           40
 RUQ(cm)       RLQ(cm)       LUQ(cm)        LLQ(cm)

Biophysical Evaluation

 Amniotic F.V:   Pocket => 2 cm             F. Tone:        Observed
 F. Movement:    Observed                   Score:          [DATE]
 F. Breathing:   Observed
Biometry

 BPD:     88.33  mm     G. Age:  35w 5d         56  %    CI:        85.15   %    70 - 86
                                                         FL/HC:      21.9   %    20.1 -
 HC:    301.54   mm     G. Age:  33w 3d        < 1  %    HC/AC:      1.01        0.93 -
 AC:    297.99   mm     G. Age:  33w 5d         11  %    FL/BPD:     74.7   %    71 - 87
 FL:      65.99  mm     G. Age:  34w 0d          9  %    FL/AC:      22.1   %    20 - 24
 CER:      47.8  mm     G. Age:  36w 0d         45  %

 LV:        4.5  mm
 CM:        9.4  mm

 Est. FW:    7477  gm      5 lb 2 oz     11  %
OB History

 Blood Type:   B+
 Gravidity:    2          SAB:   1
 Living:       0
Gestational Age

 LMP:           35w 5d        Date:  08/28/21                  EDD:   06/04/22
 U/S Today:     34w 2d                                        EDD:   06/14/22
 Best:          35w 5d     Det. By:  LMP  (08/28/21)          EDD:   06/04/22
Anatomy

 Cranium:               Appears normal         LVOT:                   Appears normal
 Cavum:                 Appears normal         Aortic Arch:            Appears normal
 Ventricles:            Appears normal         Ductal Arch:            Previously seen
 Choroid Plexus:        Previously seen        Diaphragm:              Appears normal
 Cerebellum:            Appears normal         Stomach:                Appears normal, left
                                                                       sided
 Posterior Fossa:       Appears normal         Abdomen:                Previously seen
 Nuchal Fold:           Not applicable (>20    Abdominal Wall:         Previously seen
                        wks GA)
 Face:                  Orbits and profile     Cord Vessels:           Appears normal (3
                        previously seen                                vessel cord)
 Lips:                  Previously seen        Kidneys:                Appear normal
 Palate:                Not well visualized    Bladder:                Previously seen
 Thoracic:              Appears normal         Spine:                  Previously seen
 Heart:                 Appears normal         Upper Extremities:      Previously seen
                        (4CH, axis, and
                        situs)
 RVOT:                  Appears normal         Lower Extremities:      Previously seen

 Other:  Male gender previously seen. VC, 3VV, 3VTV, Heels/feet, and open
         hands/5th digits previously visualized.
Doppler - Fetal Vessels

 Umbilical Artery
  S/D     %tile      RI    %tile      PI    %tile     PSV    ADFV    RDFV
                                                    (cm/s)
  2.56       59    0.61       65    0.[REDACTED]      No      No

Cervix Uterus Adnexa

 Cervix
 Not visualized (advanced GA >39wks)

 Uterus
 No abnormality visualized.

 Right Ovary
 Within normal limits.

 Left Ovary
 Within normal limits.

 Cul De Sac
 No free fluid seen.

 Adnexa
 No abnormality visualized.
Comments

 This patient was seen for a BPP and growth scan due to
 maternal obesity with a BMI of 45.  She denies any problems
 since her last exam.
 The overall EFW of 5 pounds 2 ounces measures at the 11th
 percentile. There was normal amniotic fluid noted on today's
 ultrasound exam.
 A biophysical profile performed today was [DATE].
 Due to maternal obesity and the low normal EFW obtained
 today, delivery is recommended at around 39 weeks.
 She should continue weekly fetal testing until delivery.
 Another BPP was scheduled in 1 week.

## 2022-11-22 IMAGING — US US MFM FETAL BPP W/O NON-STRESS
1 series · 14 of 28 positions shown · non-contrast
Comparison: none

[Series 1: us mfm fetal bpp w/o non-stress · 28 acquisitions, 14 frames shown]
[im 2/28]
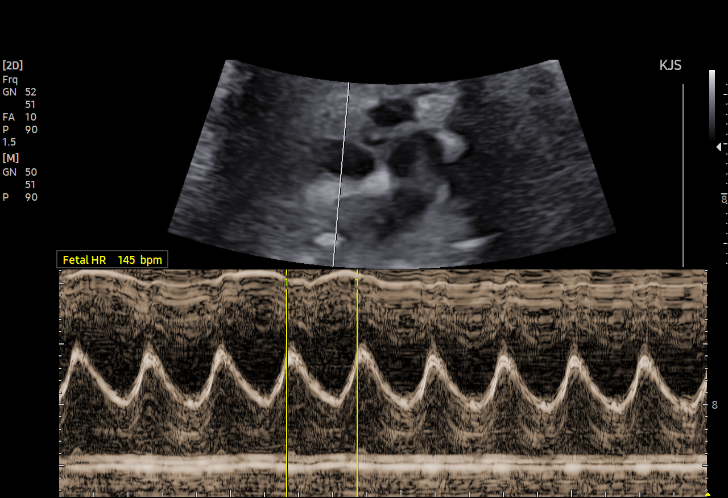
[im 4/28]
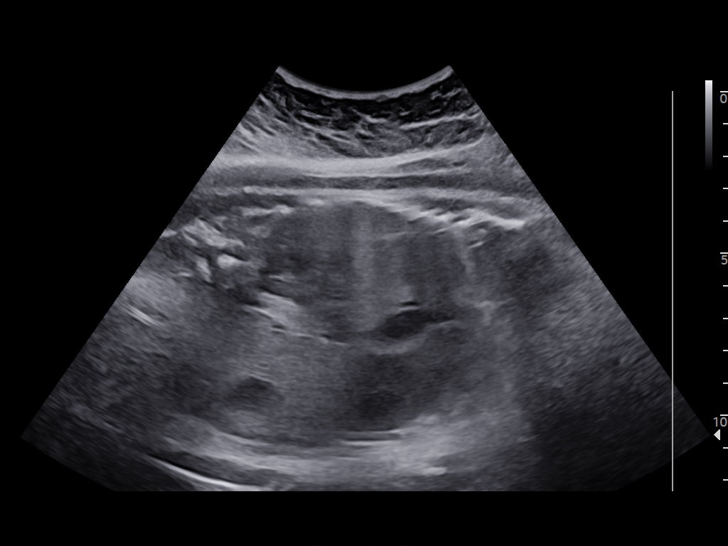
[im 6/28]
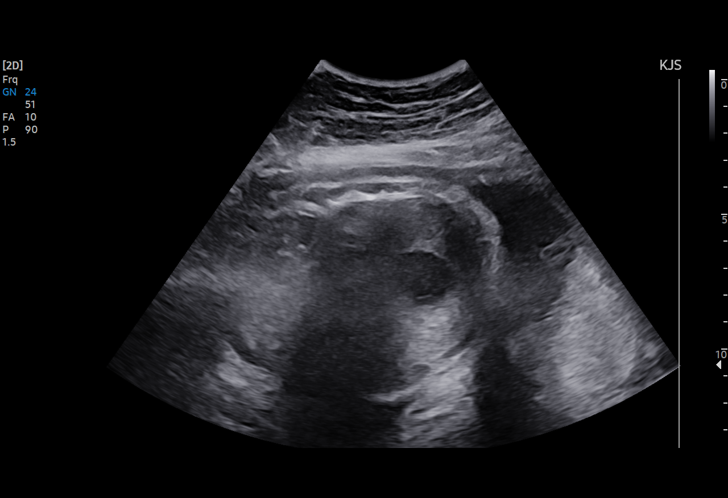
[im 8/28]
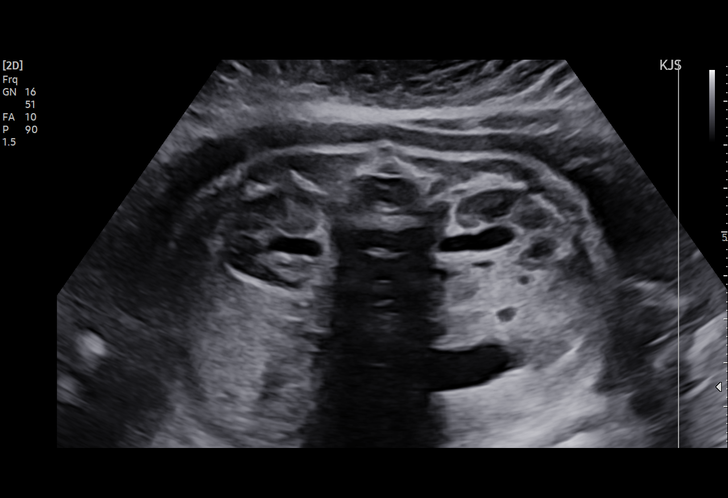
[im 10/28]
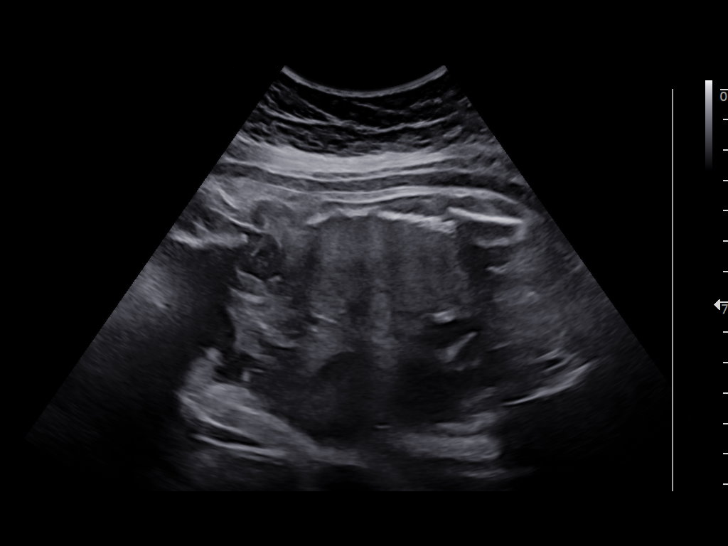
[im 12/28]
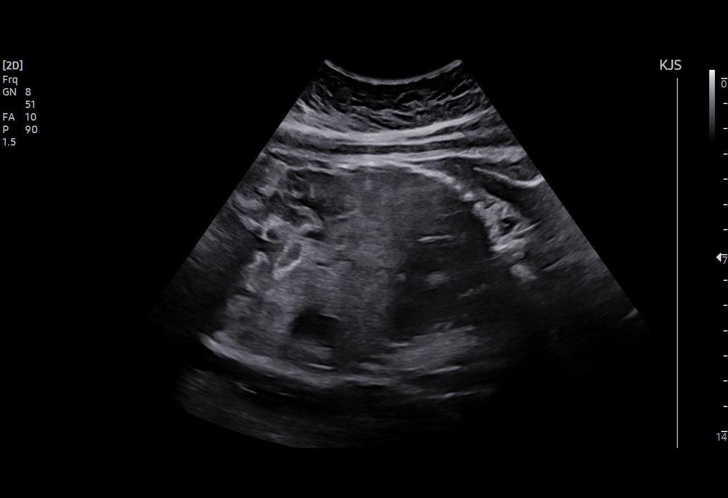
[im 14/28]
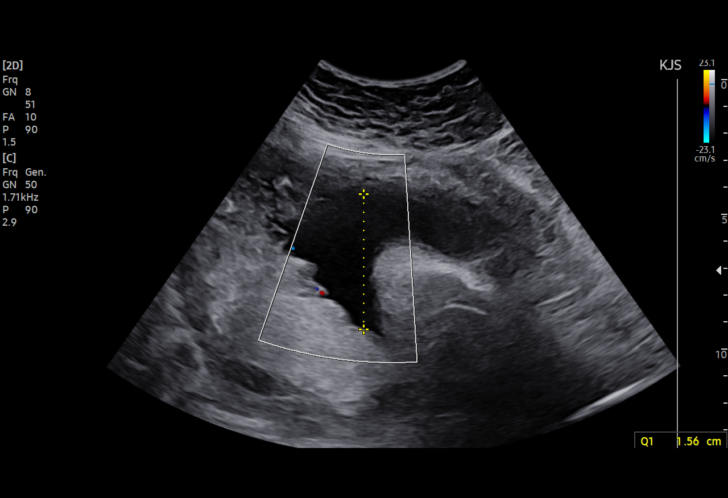
[im 16/28]
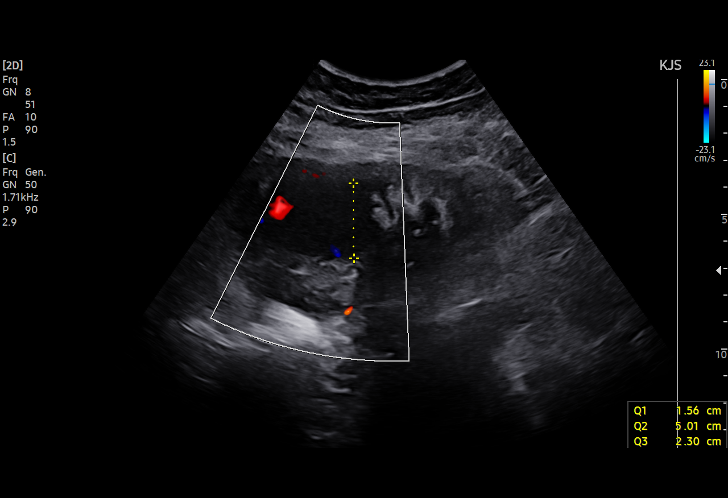
[im 18/28]
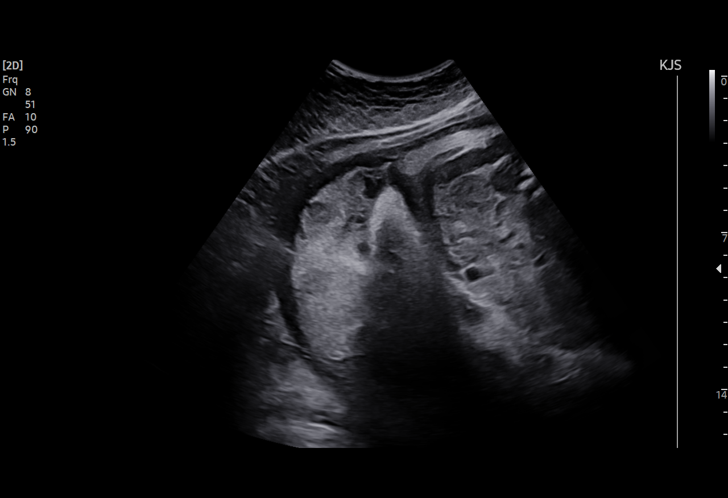
[im 20/28]
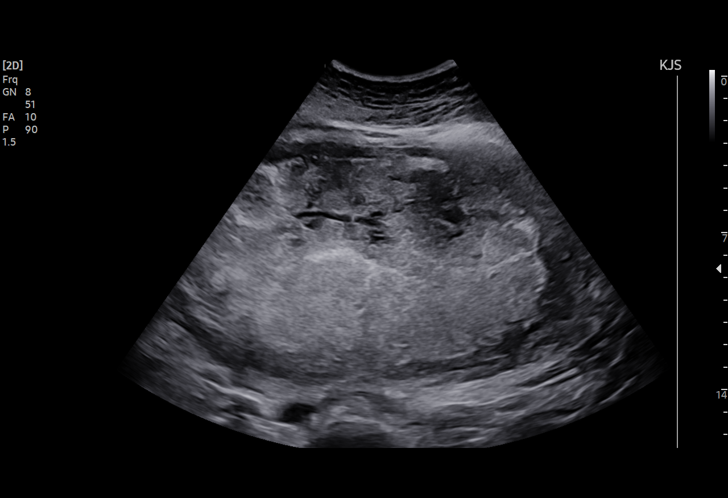
[im 22/28]
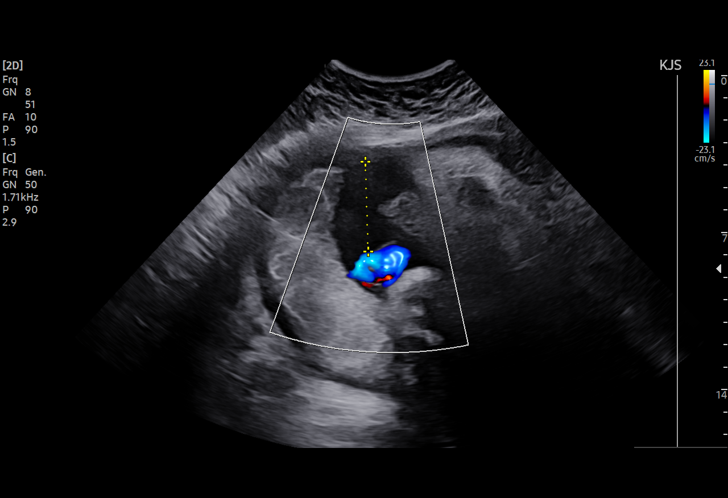
[im 24/28]
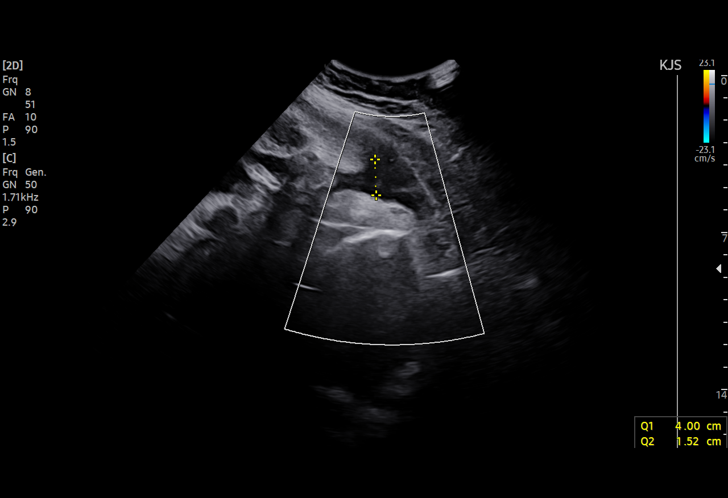
[im 26/28]
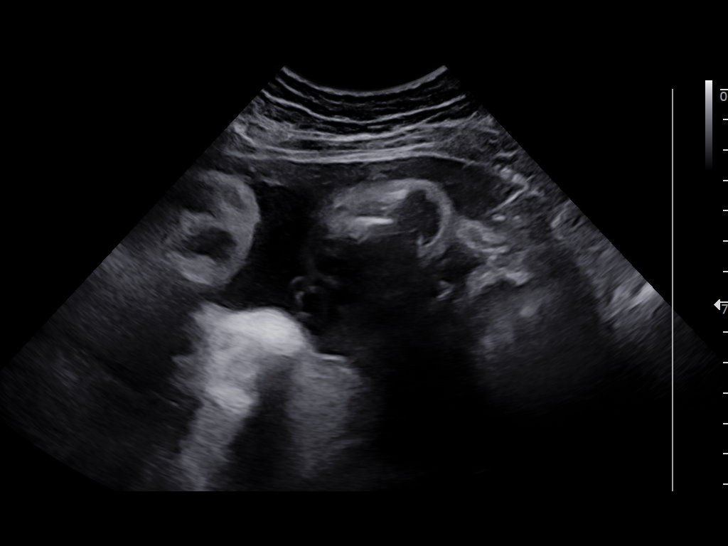
[im 28/28]
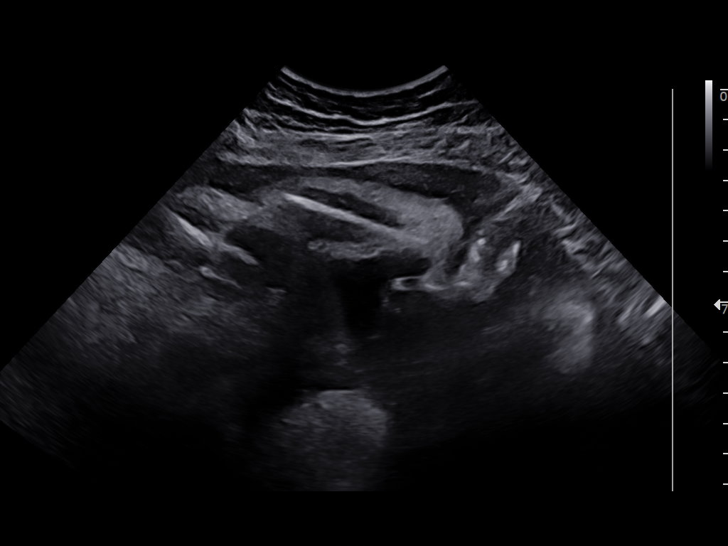

[14 of 28 positions shown; findings below may reference images not displayed]

Attending:        Abimelk Tiger      Secondary Phy.:    [REDACTED]
                   [facility]                                   [HOSPITAL] at

Indications

 Obesity complicating pregnancy, third
 trimester (BMI 45)
 Genetic carrier (increased carrier risk for
 SMA)
 Genetic carrier (silent carrier alpha
 thalassemia)
 36 weeks gestation of pregnancy
 LR NIPS/Negative AFP
Fetal Evaluation

 Num Of Fetuses:          1
 Fetal Heart Rate(bpm):   145
 Cardiac Activity:        Observed
 Presentation:            Cephalic
 Placenta:                Posterior
 P. Cord Insertion:       Previously Visualized

 Amniotic Fluid
 AFI FV:      Within normal limits

 AFI Sum(cm)     %Tile       Largest Pocket(cm)
 11.65           35

 RUQ(cm)       RLQ(cm)       LUQ(cm)        LLQ(cm)

Biophysical Evaluation

 Amniotic F.V:   Pocket => 2 cm             F. Tone:         Observed
 F. Movement:    Observed                   Score:           [DATE]
 F. Breathing:   Observed
OB History

 Blood Type:   B+
 Gravidity:    2          SAB:   1
 Living:       0
Gestational Age

 LMP:           36w 4d        Date:  08/28/21                  EDD:   06/04/22
 Best:          36w 4d     Det. By:  LMP  (08/28/21)          EDD:   06/04/22
Impression

 Antenatal testing performed given elevated maternal BMI
 The biophysical profile was [DATE] with good fetal movement and
 amniotic fluid volume.
Recommendations

 Continue weekly testing given elevated maternal BMI-
 (Scheduled with her providers).

## 2023-01-03 ENCOUNTER — Encounter: Payer: Self-pay | Admitting: Family Medicine

## 2023-01-03 ENCOUNTER — Ambulatory Visit (INDEPENDENT_AMBULATORY_CARE_PROVIDER_SITE_OTHER): Payer: No Typology Code available for payment source | Admitting: Family Medicine

## 2023-01-03 VITALS — BP 116/86 | HR 90 | Ht 68.0 in | Wt 278.1 lb

## 2023-01-03 DIAGNOSIS — N62 Hypertrophy of breast: Secondary | ICD-10-CM | POA: Diagnosis not present

## 2023-01-03 NOTE — Patient Instructions (Addendum)
It was nice seeing you today!  I have placed the referral for you.  [facility] [phone] [address], [place], [place] [address]   You can also schedule nutrition-focused visits with [place] if you're interested.  Stay well, Zola Button, MD [place] [phone]  --  Make sure to check out at the front desk before you leave today.  Please arrive at least 15 minutes prior to your scheduled appointments.  If you had blood work today, I will send you a MyChart message or a letter if results are normal. Otherwise, I will give you a call.  If you had a referral placed, they will call you to set up an appointment. Please give [place] a call if you don't hear back in the next 2 weeks.  If you need additional refills before your next appointment, please call your pharmacy first.

## 2023-01-03 NOTE — Progress Notes (Signed)
    SUBJECTIVE:   CHIEF COMPLAINT / HPI:  Chief Complaint  Patient presents with   Referral    Wants referral for breast reduction. Scheduled to see [facility] next month but needs referral Bra size H Reports pain neck, back, and shoulders due to large breasts Has seen two specialists in the past  Wants to discuss weight loss, wonders if there is medication that she can try Has been working with a Physiological scientist, they also work on dietary changes Frustrated that she has not noticed much weight loss with personal training and diet changes  PERTINENT  PMH / PSH: obesity  Patient Care Team: Zola Button, MD as PCP - General (Family Medicine) Renee Harder, CNM as PCP - OBGYN (Obstetrics and Gynecology)   OBJECTIVE:   BP 116/86   Pulse 90   Ht 5\' 8"  (1.727 m)   Wt 278 lb 2 oz (126.2 kg)   LMP 11/30/2022   SpO2 100%   BMI 42.29 kg/m   Physical Exam Constitutional:      General: She is not in acute distress.    Appearance: She is obese.  HENT:     Head: Normocephalic and atraumatic.  Cardiovascular:     Rate and Rhythm: Normal rate and regular rhythm.  Pulmonary:     Effort: Pulmonary effort is normal. No respiratory distress.     Breath sounds: Normal breath sounds.  Musculoskeletal:     Cervical back: Neck supple.  Neurological:     Mental Status: She is alert.         01/03/2023   11:24 AM  Depression screen PHQ 2/9  Decreased Interest 0  Down, Depressed, Hopeless 0  PHQ - 2 Score 0  Altered sleeping 0  Tired, decreased energy 0  Change in appetite 0  Feeling bad or failure about yourself  0  Trouble concentrating 0  Moving slowly or fidgety/restless 0  Suicidal thoughts 0  PHQ-9 Score 0     {Show previous vital signs (optional):23777}    ASSESSMENT/PLAN:   1. Large breasts Already has appointment scheduled, placed referral per their request I think she would benefit from breast reduction surgery based on pain that is caused  by her large breasts - Ambulatory referral to Plastic Surgery  2. Morbid obesity ([place]) Discussed that she would not likely qualify for GLP-1 agonist.  Encouraged her exercise and dietary changes.  Information provided for healthy weight and wellness, also offered nutrition focused visits at our clinic.  Return in about 3 months (around 04/03/2023) for physical.   Zola Button, MD [place]

## 2023-01-24 ENCOUNTER — Institutional Professional Consult (permissible substitution): Payer: BC Managed Care – PPO | Admitting: Plastic Surgery

## 2023-02-08 ENCOUNTER — Ambulatory Visit: Payer: No Typology Code available for payment source | Admitting: Family Medicine

## 2023-02-09 ENCOUNTER — Ambulatory Visit (INDEPENDENT_AMBULATORY_CARE_PROVIDER_SITE_OTHER): Payer: No Typology Code available for payment source | Admitting: Family Medicine

## 2023-02-09 ENCOUNTER — Encounter: Payer: Self-pay | Admitting: Family Medicine

## 2023-02-09 ENCOUNTER — Other Ambulatory Visit: Payer: Self-pay

## 2023-02-09 MED ORDER — TIRZEPATIDE-WEIGHT MANAGEMENT 2.5 MG/0.5ML ~~LOC~~ SOAJ: 2.5000 mg | SUBCUTANEOUS | 0 refills | Status: DC

## 2023-02-09 NOTE — Patient Instructions (Signed)
It was great to meet you!  I have sent a prescription for Tirzepatide (Zepbound) to help with weight loss. Hopefully this will be covered by insurance.  It's a once weekly injection. Most common side effect is GI upset (nausea, vomiting, change in stool pattern).  It is extremely important to combine medication with lifestyle efforts-- healthy dietary choices including eliminating sugar-sweetened beverages (soda, sweet tea, juice, lemonade, etc) and limiting processed foods as well as regular physical activity (150 minutes per week).  Follow up in 1 month  -Dr Rock Nephew

## 2023-02-09 NOTE — Progress Notes (Signed)
    SUBJECTIVE:   CHIEF COMPLAINT / HPI:   Weight Management -BMI has to be below 40 for breast reduction surgery -Previously working with her OB on weight management, tried Metformin in the past without success -also trying to work on lifestyle modifications, watching her diet -got a Physiological scientist (sees them 3 days a week at [employer]) -not seeing the results she wants -would like to try alternate medication for weight loss  Breast Reduction -[facility] (Dr Harl Bowie) said she has to be below BMI 40 to even be seen for consultation -has appt in April  PERTINENT  PMH / PSH: PCOS  OBJECTIVE:   BP 137/75   Pulse 94   Ht 5\' 8"  (1.727 m)   Wt 280 lb 3.2 oz (127.1 kg)   LMP 01/09/2023   SpO2 100%   BMI 42.60 kg/m   General: obese, NAD, pleasant, able to participate in exam Respiratory: No respiratory distress Skin: warm and dry, no rashes noted Psych: Normal affect and mood Neuro: grossly intact  ASSESSMENT/PLAN:   Morbid obesity ([place]) BMI 42. Needs below 40 to be considered for breast reduction. Has failed attempts at lifestyle intervention even with personal trainer and nutrition counseling. Has also failed trial of Metformin in the past. Rx sent for [facility] 2.5mg  weekly. Plan to titrate to 5mg  weekly after 4 weeks. Follow up in 1 month.    Alcus Dad, MD [place]

## 2023-02-09 NOTE — Assessment & Plan Note (Addendum)
BMI 42. Needs below 40 to be considered for breast reduction. Has failed attempts at lifestyle intervention even with personal trainer and nutrition counseling. Has also failed trial of Metformin in the past. Rx sent for [facility] 2.5mg  weekly. Plan to titrate to 5mg  weekly after 4 weeks. Follow up in 1 month.

## 2023-02-12 ENCOUNTER — Telehealth: Payer: Self-pay

## 2023-02-12 NOTE — Telephone Encounter (Signed)
Patient calls nurse line in regards to Tirzepatide prescription.   She is reporting a PA is needed on this medication.   Advised will forward to pharmacy to imitate PA. Advised the process could take up to 1 week.

## 2023-02-15 ENCOUNTER — Ambulatory Visit (INDEPENDENT_AMBULATORY_CARE_PROVIDER_SITE_OTHER): Payer: No Typology Code available for payment source | Admitting: Family Medicine

## 2023-02-15 ENCOUNTER — Other Ambulatory Visit (HOSPITAL_COMMUNITY)
Admission: RE | Admit: 2023-02-15 | Discharge: 2023-02-15 | Disposition: A | Discharge status: Home / Self Care | Payer: No Typology Code available for payment source | Source: Ambulatory Visit | Attending: Family Medicine | Admitting: Family Medicine

## 2023-02-15 ENCOUNTER — Other Ambulatory Visit (HOSPITAL_COMMUNITY): Payer: Self-pay

## 2023-02-15 VITALS — BP 110/80 | HR 84 | Ht 68.0 in | Wt 278.8 lb

## 2023-02-15 DIAGNOSIS — Z124 Encounter for screening for malignant neoplasm of cervix: Secondary | ICD-10-CM | POA: Diagnosis not present

## 2023-02-15 DIAGNOSIS — N898 Other specified noninflammatory disorders of vagina: Secondary | ICD-10-CM

## 2023-02-15 LAB — POCT WET PREP (WET MOUNT)
Clue Cells Wet Prep Whiff POC: NEGATIVE
Trichomonas Wet Prep HPF POC: ABSENT
WBC, Wet Prep HPF POC: >20

## 2023-02-15 NOTE — Progress Notes (Signed)
    SUBJECTIVE:   CHIEF COMPLAINT / HPI:   Vaginal Discharge -started 4 days ago -described as slimy, white -sometimes an associated odor but not always -no dysuria or urinary urgency -sexually active with 1 female partner -not using contraception -requests STI testing as well -unable to leave urine sample today, LMP 2/20- has irregular periods due to PCOS. States she will take home pregnancy test instead.  PERTINENT  PMH / PSH: obesity, PCOS  OBJECTIVE:   BP 110/80   Pulse 84   Ht 5\' 8"  (1.727 m)   Wt 278 lb 12.8 oz (126.5 kg)   LMP  (LMP Unknown)   SpO2 98%   BMI 42.39 kg/m   General: NAD, pleasant, able to participate in exam Respiratory: No respiratory distress Skin: warm and dry, no rashes noted Psych: Normal affect and mood Neuro: grossly intact GU/GYN: Exam performed in the presence of a chaperone. External genitalia within normal limits.  Vaginal mucosa pink, moist, normal rugae.  Nonfriable cervix without lesions, no discharge or bleeding noted on speculum exam.  ASSESSMENT/PLAN:   Vaginal discharge May be physiologic. Wet prep unremarkable. F/u gonorrhea/chlamydia results which were obtained with pap. F/u HIV and RPR which were also done today. Patient to take home pregnancy test as she could not leave urine sample today.  Cervical Cancer Screening Pap done today   Alcus Dad, MD [place]

## 2023-02-15 NOTE — Assessment & Plan Note (Addendum)
May be physiologic. Wet prep unremarkable. F/u gonorrhea/chlamydia results which were obtained with pap. F/u HIV and RPR which were also done today. Patient to take home pregnancy test as she could not leave urine sample today.

## 2023-02-15 NOTE — Patient Instructions (Addendum)
It was great to see you!  Today we checked for yeast and BV. I will send you a Mychart message with these results by the end of the day.   We also did your pap smear (cervical cancer screening) as well as testing for sexually transmitted infections. I will send you a MyChart message in a few days with those results.  I will look into your prior-auth situation for the weight loss medication.  Take care and seek immediate care sooner if you develop any concerns.  Dr. [facility]

## 2023-02-16 LAB — RPR: RPR Ser Ql: NONREACTIVE

## 2023-02-16 LAB — HIV ANTIBODY (ROUTINE TESTING W REFLEX): HIV Screen 4th Generation wRfx: NONREACTIVE

## 2023-02-19 LAB — CYTOLOGY - PAP
Chlamydia: NEGATIVE
Comment: NEGATIVE
Comment: NEGATIVE
Comment: NORMAL
Diagnosis: NEGATIVE
Neisseria Gonorrhea: NEGATIVE
Trichomonas: NEGATIVE

## 2023-02-19 NOTE — Telephone Encounter (Signed)
Patient returned call to nurse line and provided a covermymeds key. She asked that I submit the PA with this information. Cover my meds key: [place]  Submitted clinical questions via covermymeds. Medication approved through 10/17/23.  Called pharmacy and provided update. Cost is $24.99 for one month supply. They will have to order this medication as they do not have in stock. They asked that I inform patient of this and also let her know about back orders on this medication. They will call her with any issues or when medication is ready for pick up.   Called patient and provided with update.   Talbot Grumbling, RN

## 2023-02-22 NOTE — Telephone Encounter (Signed)
Thank you :)

## 2023-03-08 ENCOUNTER — Encounter: Payer: Self-pay | Admitting: Student

## 2023-03-08 ENCOUNTER — Ambulatory Visit (INDEPENDENT_AMBULATORY_CARE_PROVIDER_SITE_OTHER): Payer: No Typology Code available for payment source | Admitting: Student

## 2023-03-08 VITALS — BP 108/76 | HR 84 | Ht 68.0 in | Wt 266.8 lb

## 2023-03-08 DIAGNOSIS — R238 Other skin changes: Secondary | ICD-10-CM

## 2023-03-08 NOTE — Patient Instructions (Signed)
It was great to see you today!   Today we addressed: Skin irritation : continue using Vaseline twice daily. This should heal with time. Based off my exam it does not look like steroid creams would benefit you. I would give this another 2-3 weeks to have complete resolution.   You should return to our clinic Return in about 6 months (around 09/07/2023) for PCP follow up.  Please arrive 15 minutes before your appointment to ensure smooth check in process.    Please call the clinic at [phone] if your symptoms worsen or you have any concerns.  Thank you for allowing me to participate in your care, Dr. Glendale Chard [facility]
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
## 2023-03-21 ENCOUNTER — Ambulatory Visit (INDEPENDENT_AMBULATORY_CARE_PROVIDER_SITE_OTHER): Payer: No Typology Code available for payment source | Admitting: Family Medicine

## 2023-03-21 ENCOUNTER — Encounter: Payer: Self-pay | Admitting: Family Medicine

## 2023-03-21 MED ORDER — TIRZEPATIDE-WEIGHT MANAGEMENT 5 MG/0.5ML ~~LOC~~ SOAJ
5.0000 mg | SUBCUTANEOUS | 0 refills | Status: DC
  Filled 2023-03-28: qty 2, 28d supply, fill #0

## 2023-03-21 NOTE — Assessment & Plan Note (Signed)
Late on Zepbound for a few days. She regained some of the weight lost. I increased her Zepbound from 2.5 mg to 5 mg weekly. Plan to increase further in the next 4 weeks as needed. May schedule MyChart visit in the future.

## 2023-03-21 NOTE — Patient Instructions (Signed)
Tirzepatide Injection (Weight Management) What is this medication? TIRZEPATIDE (tir ZEP a tide) promotes weight loss. It may also be used to maintain weight loss. It works by decreasing appetite. Changes to diet and exercise are often combined with this medication. This medicine may be used for other purposes; ask your health care provider or pharmacist if you have questions. COMMON BRAND NAME(S): Zepbound What should I tell my care team before I take this medication? They need to know if you have any of these conditions: Eye disease caused by diabetes Gallbladder disease History of depression Pancreatic disease Kidney disease Stomach or intestine problems, such as problems digesting food Suicidal thoughts, plans, or attempt by you or a family member Personal or family history of MEN 2, a condition that causes endocrine gland tumors Personal or family history of thyroid cancer An unusual or allergic reaction to tirzepatide, other medications, foods, dyes, or preservatives Pregnant or trying to get pregnant Breastfeeding How should I use this medication? This medication is injected under the skin. You will be taught how to prepare and give it. Take it as directed on the prescription label. Keep taking it unless your care team tells you to stop. It is important that you put your used needles and syringes in a special sharps container. Do not put them in a trash can. If you do not have a sharps container, call your pharmacist or care team to get one. A special MedGuide will be given to you by the pharmacist with each prescription and refill. Be sure to read this information carefully each time. This medication comes with INSTRUCTIONS FOR USE. Ask your pharmacist for directions on how to use this medication. Read the information carefully. Talk to your pharmacist or care team if you have questions. Talk to your care team about the use of this medication in children. Special care may be  needed. Overdosage: If you think you have taken too much of this medicine contact a poison control center or emergency room at once. NOTE: This medicine is only for you. Do not share this medicine with others. What if I miss a dose? If you miss a dose, take it as soon as you can unless it is more than 4 days (96 hours) late. If it is more than 4 days late, skip the missed dose. Take the next dose at the normal time. Do not take 2 doses within 3 days (72 hours) of each other. What may interact with this medication? Certain medications for diabetes, such as insulin, glyburide, glipizide This medication may affect how other medications work. Talk with your care team about all of the medications you take. They may suggest changes to your treatment plan to lower the risk of side effects and to make sure your medications work as intended. This list may not describe all possible interactions. Give your health care provider a list of all the medicines, herbs, non-prescription drugs, or dietary supplements you use. Also tell them if you smoke, drink alcohol, or use illegal drugs. Some items may interact with your medicine. What should I watch for while using this medication? Visit your care team for regular checks on your progress. It may be some time before you see the benefit from this medication. Check with your care team if you have severe diarrhea, nausea, and vomiting, or if you sweat a lot. The loss of too much body fluid may make it dangerous for you to take this medication. Tell your care team if you are taking   medications to treat diabetes, such as insulin or sulfonylureas. This may increase your risk of low blood sugar. Know the symptoms of low blood sugar and how to treat it. Talk to your care team about your risk of cancer. You may be more at risk for certain types of cancer if you take this medication. Estrogen and progestin hormones may not work as well while you are taking this medication. If  you take these as pills by mouth, your care team may recommend another type of contraception for 4 weeks after you start this medication and for 4 weeks after each dose increase. Talk to your care team about contraceptive options. They can help you find the option that works for you. What side effects may I notice from receiving this medication? Side effects that you should report to your care team as soon as possible: Allergic reactions or angioedema--skin rash, itching or hives, swelling of the face, eyes, lips, tongue, arms, or legs, trouble swallowing or breathing Bowel blockage--stomach cramping, unable to have a bowel movement or pass gas, loss of appetite, vomiting Change in vision Dehydration--increased thirst, dry mouth, feeling faint or lightheaded, headache, dark yellow or brown urine Gallbladder problems--severe stomach pain, nausea, vomiting, fever Kidney injury--decrease in the amount of urine, swelling of the ankles, hands, or feet Pancreatitis--severe stomach pain that spreads to your back or gets worse after eating or when touched, fever, nausea, vomiting Thoughts of suicide or self-harm, worsening mood, feelings of depression Thyroid cancer--new mass or lump in the neck, pain or trouble swallowing, trouble breathing, hoarseness Side effects that usually do not require medical attention (report these to your care team if they continue or are bothersome): Constipation Diarrhea Nausea Pain, redness, or irritation at injection site Stomach pain Upset stomach Vomiting This list may not describe all possible side effects. Call your doctor for medical advice about side effects. You may report side effects to FDA at 1-800-FDA-1088. Where should I keep my medication? Keep out of the reach of children and pets. Store in a refrigerator or at room temperature up to 30 degrees C (86 degrees F). Keep it in the original container. Protect from light. Refrigeration (preferred): Store in the  refrigerator. Do not freeze. Get rid of any unused medication after the expiration date. Room temperature: This medication may be stored at room temperature for up to 21 days. If it is stored at room temperature, get rid of any unused medication after 21 days or after it expires, whichever is first. To get rid of medications that are no longer needed or have expired: Take the medication to a medication take-back program. Check with your pharmacy or law enforcement to find a location. If you cannot return the medication, ask your pharmacist or care team how to get rid of this medication safely. NOTE: This sheet is a summary. It may not cover all possible information. If you have questions about this medicine, talk to your doctor, pharmacist, or health care provider.  2023 Elsevier/Gold Standard (2022-10-02 00:00:00)  

## 2023-03-21 NOTE — Progress Notes (Signed)
    SUBJECTIVE:   CHIEF COMPLAINT / HPI:   Weight management: She is here to follow up on her weight management. She is currently on Zepbound 2.5 mg weekly. Her last dose was more than 1 week ago. She tolerated the 2.5 mg dose with no side effects. She denies family hx or personal hx of thyroid cancer.  She has been staling on her exercise for the last few days due to her move. However, she is doing well with a healthy diet.    PERTINENT  PMH / PSH: PMHx reviewed.  OBJECTIVE:   BP 131/71   Ht 5\' 8"  (1.727 m)   Wt 269 lb 6 oz (122.2 kg)   LMP 02/20/2023   BMI 40.96 kg/m   Physical Exam Vitals and nursing note reviewed.  Cardiovascular:     Rate and Rhythm: Normal rate and regular rhythm.     Heart sounds: Normal heart sounds. No murmur heard. Pulmonary:     Effort: Pulmonary effort is normal. No respiratory distress.     Breath sounds: Normal breath sounds. No wheezing.  Abdominal:     General: Abdomen is flat. Bowel sounds are normal. There is no distension.     Palpations: Abdomen is soft. There is no mass.     Tenderness: There is no abdominal tenderness.      ASSESSMENT/PLAN:   Morbid obesity (HCC) Late on Zepbound for a few days. She regained some of the weight lost. I increased her Zepbound from 2.5 mg to 5 mg weekly. Plan to increase further in the next 4 weeks as needed. May schedule MyChart visit in the future.     Janit Pagan, MD [facility]

## 2023-03-28 ENCOUNTER — Other Ambulatory Visit: Payer: Self-pay

## 2023-04-05 ENCOUNTER — Encounter: Payer: Self-pay | Admitting: Family Medicine

## 2023-04-12 ENCOUNTER — Encounter: Payer: Self-pay | Admitting: Family Medicine

## 2023-04-12 NOTE — Telephone Encounter (Signed)
Attempted to reach patient. No answer. Unable to LVM. Due to one not being set up. Phone just rang. Will try later. Aquilla Solian, CMA.

## 2023-04-13 ENCOUNTER — Ambulatory Visit (INDEPENDENT_AMBULATORY_CARE_PROVIDER_SITE_OTHER): Payer: No Typology Code available for payment source | Admitting: Student

## 2023-04-13 VITALS — BP 116/85 | HR 76 | Ht 68.0 in | Wt 267.1 lb

## 2023-04-13 DIAGNOSIS — N939 Abnormal uterine and vaginal bleeding, unspecified: Secondary | ICD-10-CM | POA: Diagnosis not present

## 2023-04-13 MED ORDER — MEGESTROL ACETATE 40 MG PO TABS: 40.0000 mg | ORAL_TABLET | Freq: Every day | ORAL | 0 refills | Status: AC

## 2023-04-13 NOTE — Patient Instructions (Signed)
It was great to see you! Thank you for allowing me to participate in your care!   Our plans for today:  -I am going to prescribe megace 40 mg daily for 10 days after this you should have a withdrawal bleed -Please follow up with PCP or OBGYN after this  Take care and seek immediate care sooner if you develop any concerns.  Levin Erp, MD

## 2023-04-13 NOTE — Progress Notes (Signed)
    SUBJECTIVE:   CHIEF COMPLAINT / HPI: Vaginal bleeding  Patient has polycystic ovarian syndrome.  She says that she was on her menstrual cycle for a week and the last day was on 5/9.  She then started bleeding again on 5/21 and says that it has not slowed down and would like something to stop this.  Denies any dizziness, lightheadedness, shortness of breath.  She denies any large amount of bleeding at this time.  Denies any abdominal pain right now.  Denies any dysuria or urinary symptoms.  Says that her periods usually last 7 to 10 days now.  They do occur monthly however they vary and when the actual day they will start.  PERTINENT  PMH / PSH: PCOS  OBJECTIVE:   BP 116/85   Pulse 76   Ht 5\' 8"  (1.727 m)   Wt 267 lb 2 oz (121.2 kg)   LMP 03/23/2023   SpO2 100%   BMI 40.62 kg/m   General: Well appearing, NAD, awake, alert, responsive to questions Head: Normocephalic atraumatic CV: Regular rate and rhythm no murmurs rubs or gallops Respiratory: Clear to ausculation bilaterally, no wheezes rales or crackles, chest rises symmetrically,  no increased work of breathing Abdomen: Soft, non-tender, non-distended Extremities: Moves upper and lower extremities freely  ASSESSMENT/PLAN:   Abnormal uterine bleeding This is now the second menstrual period this month.  She would like to have something to stop her bleeding at the moment.  Discussed with her that we can trial Megace and that she will have a withdrawal bleed after this.  Hopeful that this will help reset some of her abnormal uterine bleeding.  If still abnormal I recommend patient to be reevaluated by PCP or OB/GYN.  Could consider additional pelvic imaging at that time.  - megestrol (MEGACE) 40 MG tablet; Take 1 tablet (40 mg total) by mouth daily for 10 days.  Dispense: 10 tablet; Refill: 0    Levin Erp, MD [facility]

## 2023-04-17 ENCOUNTER — Other Ambulatory Visit: Payer: Self-pay | Admitting: Family Medicine

## 2023-04-19 ENCOUNTER — Other Ambulatory Visit (HOSPITAL_COMMUNITY): Payer: Self-pay

## 2023-05-15 ENCOUNTER — Ambulatory Visit: Payer: No Typology Code available for payment source | Admitting: Student

## 2023-10-02 ENCOUNTER — Encounter: Payer: Self-pay | Admitting: Family Medicine

## 2023-10-02 ENCOUNTER — Telehealth: Payer: Self-pay | Admitting: Family Medicine

## 2023-10-02 ENCOUNTER — Other Ambulatory Visit (HOSPITAL_COMMUNITY)
Admission: RE | Admit: 2023-10-02 | Discharge: 2023-10-02 | Disposition: A | Discharge status: Home / Self Care | Payer: Commercial Managed Care - PPO | Source: Ambulatory Visit | Attending: Family Medicine | Admitting: Family Medicine

## 2023-10-02 ENCOUNTER — Ambulatory Visit (INDEPENDENT_AMBULATORY_CARE_PROVIDER_SITE_OTHER): Payer: Commercial Managed Care - PPO | Admitting: Family Medicine

## 2023-10-02 VITALS — BP 127/79 | HR 85 | Ht 68.0 in | Wt 270.0 lb

## 2023-10-02 DIAGNOSIS — R6889 Other general symptoms and signs: Secondary | ICD-10-CM | POA: Insufficient documentation

## 2023-10-02 DIAGNOSIS — N926 Irregular menstruation, unspecified: Secondary | ICD-10-CM | POA: Diagnosis not present

## 2023-10-02 DIAGNOSIS — R739 Hyperglycemia, unspecified: Secondary | ICD-10-CM

## 2023-10-02 DIAGNOSIS — E8881 Metabolic syndrome: Secondary | ICD-10-CM | POA: Diagnosis not present

## 2023-10-02 DIAGNOSIS — Z113 Encounter for screening for infections with a predominantly sexual mode of transmission: Secondary | ICD-10-CM | POA: Diagnosis not present

## 2023-10-02 DIAGNOSIS — E559 Vitamin D deficiency, unspecified: Secondary | ICD-10-CM

## 2023-10-02 DIAGNOSIS — D649 Anemia, unspecified: Secondary | ICD-10-CM

## 2023-10-02 DIAGNOSIS — Z114 Encounter for screening for human immunodeficiency virus [HIV]: Secondary | ICD-10-CM

## 2023-10-02 DIAGNOSIS — E282 Polycystic ovarian syndrome: Secondary | ICD-10-CM

## 2023-10-02 DIAGNOSIS — M25562 Pain in left knee: Secondary | ICD-10-CM

## 2023-10-02 DIAGNOSIS — G8929 Other chronic pain: Secondary | ICD-10-CM

## 2023-10-02 DIAGNOSIS — Z6841 Body Mass Index (BMI) 40.0 and over, adult: Secondary | ICD-10-CM

## 2023-10-02 LAB — POCT GLYCOSYLATED HEMOGLOBIN (HGB A1C): Hemoglobin A1C: 5.3 % (ref 4.0–5.6)

## 2023-10-02 LAB — POCT URINE PREGNANCY: Preg Test, Ur: NEGATIVE

## 2023-10-02 NOTE — Patient Instructions (Signed)
Phentermine; Topiramate Extended-Release Capsules What is this medication? Phentermine; Topiramate (FEN ter meen; Toe PYRE a mate) promotes weight loss. It may also be used to maintain weight loss. It works by decreasing appetite. Changes to diet and exercise are often combined with this medication. This medicine may be used for other purposes; ask your health care provider or pharmacist if you have questions. COMMON BRAND NAME(S): Qsymia What should I tell my care team before I take this medication? They need to know if you have any of these conditions: Bone problems Depression or other mental health condition Diabetes Diarrhea Glaucoma Having surgery Heart disease High blood pressure History of heart attack or stroke History of irregular heartbeat History of substance use disorder or alcohol use disorder Kidney disease or stones Liver disease Low levels of potassium in the blood Lung or breathing disease, like asthma Metabolic acidosis On a ketogenic diet Seizures Suicidal thoughts, plans, or attempt by you or a family member Taken an MAOI like Carbex, Eldepryl, Marplan, Nardil, or Parnate in last 14 days Thyroid disease An unusual or allergic reaction to phentermine, topiramate, other medications, foods, dyes, or preservatives Pregnant or trying to get pregnant Breast-feeding How should I use this medication? Take this medication by mouth with a glass of water. Follow the directions on the prescription label. Do not crush or chew. This medication is usually taken with or without food once per day in the morning. Avoid taking this medication in the evening. It may interfere with sleep. Take your doses at regular intervals. Do not take your medication more often than directed. A special MedGuide will be given to you by the pharmacist with each prescription and refill. Be sure to read this information carefully each time. Talk to your care team about the use of this medication in  children. While it may be prescribed for children as young as 12 years for selected conditions, precautions do apply. Overdosage: If you think you have taken too much of this medicine contact a poison control center or emergency room at once. NOTE: This medicine is only for you. Do not share this medicine with others. What if I miss a dose? If you miss a dose, skip it. Take your next dose at the normal time. Do not take extra or 2 doses at the same time to make up for the missed dose. What may interact with this medication? Do not take this medication with any of the following: MAOIs like Carbex, Eldepryl, Marplan, Nardil, and Parnate This medication may also interact with the following: Acetazolamide Alcohol Antihistamines for allergy, cough and cold Atropine Birth control pills Carbamazepine Certain medications for bladder problems like oxybutynin, tolterodine Certain medications for depression, anxiety, or psychotic disturbances Certain medications for high blood pressure Certain medications for Parkinson disease like benztropine, trihexyphenidyl Certain medications for sleep Certain medications for stomach problems like dicyclomine, hyoscyamine Certain medications for travel sickness like scopolamine Dichlorphenamide Digoxin Diuretics Linezolid Medications for colds or breathing difficulties like pseudoephedrine or phenylephrine Medications for diabetes Methazolamide Narcotic medications for pain Phenytoin Sibutramine Stimulant medications for attention disorders, weight loss, or to stay awake Valproic acid Zonisamide This list may not describe all possible interactions. Give your health care provider a list of all the medicines, herbs, non-prescription drugs, or dietary supplements you use. Also tell them if you smoke, drink alcohol, or use illegal drugs. Some items may interact with your medicine. What should I watch for while using this medication? Visit your care team for  regular checks on  your progress. Do not stop taking this medication except on your care team's advice. You may develop a severe reaction. Your care team will tell you how much medication to take. Do not take this medication close to bedtime. It may prevent you from sleeping. Avoid extreme heat. This medication can cause you to sweat less than normal. Your body temperature could increase to dangerous levels, which may lead to heat stroke. You should drink plenty of fluids while taking this medication. If you have had kidney stones in the past, this will help to reduce your chances of forming kidney stones. You may get drowsy or dizzy. Do not drive, use machinery, or do anything that needs mental alertness until you know how this medication affects you. Do not stand or sit up quickly, especially if you are an older patient. This reduces the risk of dizzy or fainting spells. Alcohol may increase dizziness and drowsiness. Avoid alcoholic drinks. This medication may affect blood sugar levels. Ask your care team if changes in diet or medications are needed if you have diabetes. Check with your care team if you have severe diarrhea, nausea, and vomiting, or if you sweat a lot. The loss of too much body fluid may make it dangerous for you to take this medication. Tell your care team right away if you have any change in your eyesight. Watch for new or worsening thoughts of suicide or depression. This includes sudden changes in mood, behaviors, or thoughts. These changes can happen at any time but are more common in the beginning of treatment or after a change in dose. Call your care team right away if you experience these thoughts or worsening depression. This medication may cause serious skin reactions. They can happen weeks to months after starting the medication. Contact your care team right away if you notice fevers or flu-like symptoms with a rash. The rash may be red or purple and then turn into blisters or  peeling of the skin. Or, you might notice a red rash with swelling of the face, lips or lymph nodes in your neck or under your arms. Birth control may not work properly while you are taking this medication. Talk to your care team about using an extra method of birth control. Tell your care team if you wish to become pregnant or think you might be pregnant. There is a potential for serious side effects and harm to an unborn child. Losing weight while pregnant is not advised and may cause harm to the unborn child. Talk to your care team for more information. What side effects may I notice from receiving this medication? Side effects that you should report to your care team as soon as possible: Allergic reactions--skin rash, itching, hives, swelling of the face, lips, tongue, or throat Difficulty with paying attention, memory, or speech Fast or irregular heartbeat Fever that does not go away, decreased sweating High acid level--trouble breathing, unusual weakness or fatigue, confusion, headache, fast or irregular heartbeat, nausea, vomiting High ammonia level--unusual weakness or fatigue, confusion, loss of appetite, nausea, vomiting, seizures Kidney injury--decrease in the amount of urine, swelling of the ankles, hands, or feet Kidney stones--blood in the urine, pain or trouble passing urine, pain in the lower back or sides Low potassium level--muscle pain or cramps, unusual weakness or fatigue, fast or irregular heartbeat, constipation Mood and behavior changes--anxiety, nervousness, confusion, hallucinations, irritability, hostility, thoughts of suicide or self-harm, worsening mood, feelings of depression Redness, blistering, peeling, or loosening of the skin, including inside  the mouth Sudden eye pain or change in vision such as blurry vision, seeing halos around lights, vision loss Side effects that usually do not require medical attention (report to your care team if they continue or are  bothersome): Burning or tingling sensation in hands or feet Change in taste Constipation Dizziness Dry mouth Trouble sleeping This list may not describe all possible side effects. Call your doctor for medical advice about side effects. You may report side effects to FDA at 1-800-FDA-1088. Where should I keep my medication? Keep out of the reach of children and pets. This medication can be abused. Keep it in a safe place to protect it from theft. Do not share it with anyone. It is only for you. Selling or giving away this medication is dangerous and against the law. Store at room temperature between 15 and 25 degrees C (59 and 77 degrees F). Protect from moisture. Keep the container tightly closed. This medication may cause harm and death if it is taken by other adults, children, or pets. It is important to get rid of the medication as soon as you no longer need it, or if it is expired. You can do this in two ways: Take the medication to a medication take-back program. Check with your pharmacy or law enforcement to find a location. If you cannot return the medication, check the label or package insert to see if the medication should be thrown out in the garbage or flushed down the toilet. If you are not sure, ask your care team. If it is safe to put in the trash, take the medication out of the container. Mix the medication with cat litter, dirt, coffee grounds, or other unwanted substance. Seal the mixture in a bag or container. Put it in the trash. NOTE: This sheet is a summary. It may not cover all possible information. If you have questions about this medicine, talk to your doctor, pharmacist, or health care provider.  2024 Elsevier/Gold Standard (2022-05-25 00:00:00)

## 2023-10-02 NOTE — Progress Notes (Addendum)
    SUBJECTIVE:   CHIEF COMPLAINT / HPI:   Weight management: She was previously on Zepbound with a good response. However, her insurance stopped covering this medication. She has been working on diet changes and exercise. Despite this, she gained extra weight. She'd like to discuss other weight loss options.   Irregular period/STD screening:  She has PCOS. However, her period had been regular since she had her daughter till 1 month ago. She missed her period in Oct and has been sexually active with the same partner without the use of protection. She requested an STD screening as well.  Cold intolerance: C/O always feeling cold when the temp is normal. She needed to use heater all year round regardless of the weather.  Left knee pain: Ongoing for months.   Also following up for her other chronic problems.   PERTINENT  PMH / PSH: PMhx reviewed  OBJECTIVE:   BP 127/79   Pulse 85   Ht 5\' 8"  (1.727 m)   Wt 270 lb (122.5 kg)   LMP 08/29/2023   SpO2 98%   BMI 41.05 kg/m   Physical Exam Vitals and nursing note reviewed. Exam conducted with a chaperone present Cleatrice Burke).  Constitutional:      Appearance: She is obese.  Cardiovascular:     Rate and Rhythm: Normal rate and regular rhythm.     Heart sounds: Normal heart sounds. No murmur heard. Pulmonary:     Effort: Pulmonary effort is normal. No respiratory distress.     Breath sounds: Normal breath sounds. No wheezing.  Abdominal:     General: Abdomen is flat. Bowel sounds are normal. There is no distension.     Palpations: Abdomen is soft. There is no mass.     Tenderness: There is no abdominal tenderness.     Hernia: No hernia is present.  Genitourinary:    General: Normal vulva.     Vagina: Normal.     Cervix: Normal.     Uterus: Normal.      Adnexa: Right adnexa normal and left adnexa normal.  Musculoskeletal:     Right knee: Normal.     Left knee: Normal.  Neurological:     Mental Status: She is alert.       ASSESSMENT/PLAN:   BMI 40.0-44.9, adult (HCC) Unable to afford Zepbound and her health insurance will not cover any weight loss medications I am uncertain if they will cover oral weight loss agents I discussed Qsymia with her and she will go review this medication and let me know if she wants to trial this medication Continue Lifestyle modification for now Cmet checked today, also given hx of elevated liver enzymes in the past A1C result pending  Irregular periods/menstrual cycles Likely related to her PCOS Upreg obtained today I will contact her with her test result  Polycystic ovary syndrome May be contributory to her irregular period Consider initiating Metformin which may also help with weight management I will contact her to discuss if Upreg comes back negative  Cold intolerance ?? Related to anemia TSH and CBC checked today  Anemia Anemia profile checked today  Vitamin D deficiency Not on supplement D level checked today I will contact her soon with her results  Left knee pain ?? DJD although she is young Xray ordered Consider PT referral depending on xray report.   STD screening: HIV, RPR, GC/Chlam checked I will call with her result  Janit Pagan, MD [facility]

## 2023-10-02 NOTE — Assessment & Plan Note (Signed)
??   Related to anemia TSH and CBC checked today

## 2023-10-02 NOTE — Telephone Encounter (Signed)
Patient returns call to nurse line. Verified name and DOB.   Advised of message per Dr. Lum Babe. Patient reports that she has tried metformin before and did not like the side effects.   She states that she will send Dr. Lum Babe a mychart message as well.   Veronda Prude, RN

## 2023-10-02 NOTE — Assessment & Plan Note (Signed)
Not on supplement D level checked today I will contact her soon with her results

## 2023-10-02 NOTE — Telephone Encounter (Signed)
Unable to leave a message.  Please advise her that her upreg is negative A1C is 5.3 - not diabetic  I'd like to discuss initiating Metformin for her PCOS. If she wants to try this I can send the medication in. Thanks.

## 2023-10-02 NOTE — Assessment & Plan Note (Addendum)
Unable to afford Zepbound and her health insurance will not cover any weight loss medications I am uncertain if they will cover oral weight loss agents I discussed Qsymia with her and she will go review this medication and let me know if she wants to trial this medication Continue Lifestyle modification for now Cmet checked today, also given hx of elevated liver enzymes in the past A1C result pending

## 2023-10-02 NOTE — Assessment & Plan Note (Signed)
May be contributory to her irregular period Consider initiating Metformin which may also help with weight management I will contact her to discuss if Upreg comes back negative

## 2023-10-02 NOTE — Assessment & Plan Note (Signed)
Anemia profile checked today

## 2023-10-02 NOTE — Assessment & Plan Note (Addendum)
Likely related to her PCOS Upreg obtained today I will contact her with her test result

## 2023-10-03 ENCOUNTER — Other Ambulatory Visit: Payer: Self-pay | Admitting: Family Medicine

## 2023-10-03 LAB — CERVICOVAGINAL ANCILLARY ONLY
Chlamydia: NEGATIVE
Comment: NEGATIVE
Comment: NEGATIVE
Comment: NORMAL
Neisseria Gonorrhea: NEGATIVE
Trichomonas: NEGATIVE

## 2023-10-03 MED ORDER — FERROUS SULFATE 325 (65 FE) MG PO TBEC: 325.0000 mg | DELAYED_RELEASE_TABLET | ORAL | 1 refills | Status: DC

## 2023-10-03 MED ORDER — VITAMIN D (ERGOCALCIFEROL) 1.25 MG (50000 UNIT) PO CAPS: 50000.0000 [IU] | ORAL_CAPSULE | ORAL | 1 refills | Status: DC

## 2023-10-04 ENCOUNTER — Other Ambulatory Visit: Payer: Self-pay | Admitting: Family Medicine

## 2023-10-04 ENCOUNTER — Telehealth: Payer: Self-pay

## 2023-10-04 LAB — CMP14+EGFR
ALT: 5 [IU]/L (ref 0–32)
AST: 12 [IU]/L (ref 0–40)
Albumin: 4.2 g/dL (ref 4.0–5.0)
Alkaline Phosphatase: 114 [IU]/L (ref 44–121)
BUN/Creatinine Ratio: 10 (ref 9–23)
BUN: 8 mg/dL (ref 6–20)
Bilirubin Total: 1 mg/dL (ref 0.0–1.2)
CO2: 23 mmol/L (ref 20–29)
Calcium: 9.1 mg/dL (ref 8.7–10.2)
Chloride: 103 mmol/L (ref 96–106)
Creatinine, Ser: 0.77 mg/dL (ref 0.57–1.00)
Globulin, Total: 2.3 g/dL (ref 1.5–4.5)
Glucose: 81 mg/dL (ref 70–99)
Potassium: 3.7 mmol/L (ref 3.5–5.2)
Sodium: 138 mmol/L (ref 134–144)
Total Protein: 6.5 g/dL (ref 6.0–8.5)
eGFR: 108 mL/min/{1.73_m2}

## 2023-10-04 LAB — ANEMIA PROFILE B
Basophils Absolute: 0 10*3/uL (ref 0.0–0.2)
Basos: 0 %
EOS (ABSOLUTE): 0 10*3/uL (ref 0.0–0.4)
Eos: 1 %
Ferritin: 26 ng/mL (ref 15–150)
Folate: 4.5 ng/mL (ref >3.0)
Hematocrit: 36.9 % (ref 34.0–46.6)
Hemoglobin: 12 g/dL (ref 11.1–15.9)
Immature Grans (Abs): 0 10*3/uL (ref 0.0–0.1)
Immature Granulocytes: 0 %
Iron Saturation: 12 % — ABNORMAL LOW (ref 15–55)
Iron: 48 ug/dL (ref 27–159)
Lymphocytes Absolute: 1.6 10*3/uL (ref 0.7–3.1)
Lymphs: 30 %
MCH: 27.6 pg (ref 26.6–33.0)
MCHC: 32.5 g/dL (ref 31.5–35.7)
MCV: 85 fL (ref 79–97)
Monocytes Absolute: 0.4 10*3/uL (ref 0.1–0.9)
Monocytes: 8 %
Neutrophils Absolute: 3.3 10*3/uL (ref 1.4–7.0)
Neutrophils: 61 %
Platelets: 287 10*3/uL (ref 150–450)
RBC: 4.34 x10E6/uL (ref 3.77–5.28)
RDW: 13.1 % (ref 11.7–15.4)
Retic Ct Pct: 2 % (ref 0.6–2.6)
Total Iron Binding Capacity: 395 ug/dL (ref 250–450)
UIBC: 347 ug/dL (ref 131–425)
Vitamin B-12: 189 pg/mL — ABNORMAL LOW (ref 232–1245)
WBC: 5.3 10*3/uL (ref 3.4–10.8)

## 2023-10-04 LAB — SYPHILIS: RPR W/REFLEX TO RPR TITER AND TREPONEMAL ANTIBODIES, TRADITIONAL SCREENING AND DIAGNOSIS ALGORITHM: RPR Ser Ql: NONREACTIVE

## 2023-10-04 LAB — HIV ANTIBODY (ROUTINE TESTING W REFLEX): HIV Screen 4th Generation wRfx: NONREACTIVE

## 2023-10-04 LAB — VITAMIN D 25 HYDROXY (VIT D DEFICIENCY, FRACTURES): Vit D, 25-Hydroxy: 13.5 ng/mL — ABNORMAL LOW (ref 30.0–100.0)

## 2023-10-04 LAB — TSH RFX ON ABNORMAL TO FREE T4: TSH: 1.64 u[IU]/mL (ref 0.450–4.500)

## 2023-10-04 MED ORDER — VITAMIN B-12 1000 MCG PO TABS: 1000.0000 ug | ORAL_TABLET | Freq: Every day | ORAL | 1 refills | Status: DC

## 2023-10-04 NOTE — Telephone Encounter (Signed)
Patient calls nurse line reporting pharmacy change.   She reports she needs the most recent medications to go to [facility].  I have updated her preferred pharmacy in her chart and I called [facility] and cancelled Vitamin B12, Ferrous Sulfate and Vitamin D.   Please resend all three to [facility] on [address].

## 2023-10-05 ENCOUNTER — Encounter: Payer: Self-pay | Admitting: Family Medicine

## 2023-10-05 ENCOUNTER — Other Ambulatory Visit: Payer: Self-pay | Admitting: Family Medicine

## 2023-10-05 MED ORDER — FERROUS SULFATE 325 (65 FE) MG PO TBEC: 325.0000 mg | DELAYED_RELEASE_TABLET | ORAL | 1 refills | Status: DC

## 2023-10-05 MED ORDER — VITAMIN B-12 1000 MCG PO TABS: 1000.0000 ug | ORAL_TABLET | Freq: Every day | ORAL | 1 refills | Status: DC

## 2023-10-05 MED ORDER — VITAMIN D (ERGOCALCIFEROL) 1.25 MG (50000 UNIT) PO CAPS: 50000.0000 [IU] | ORAL_CAPSULE | ORAL | 1 refills | Status: DC

## 2023-10-08 ENCOUNTER — Ambulatory Visit (HOSPITAL_COMMUNITY)
Admission: RE | Admit: 2023-10-08 | Discharge: 2023-10-08 | Disposition: A | Discharge status: Home / Self Care | Payer: Commercial Managed Care - PPO | Source: Ambulatory Visit | Attending: Family Medicine | Admitting: Family Medicine

## 2023-10-08 DIAGNOSIS — M25562 Pain in left knee: Secondary | ICD-10-CM | POA: Diagnosis present

## 2023-10-08 DIAGNOSIS — G8929 Other chronic pain: Secondary | ICD-10-CM | POA: Diagnosis present

## 2023-10-16 ENCOUNTER — Other Ambulatory Visit: Payer: Self-pay | Admitting: Family Medicine

## 2023-10-16 DIAGNOSIS — M25562 Pain in left knee: Secondary | ICD-10-CM | POA: Insufficient documentation

## 2023-10-16 DIAGNOSIS — G8929 Other chronic pain: Secondary | ICD-10-CM

## 2023-10-16 NOTE — Assessment & Plan Note (Signed)
??   DJD although she is young Xray ordered Consider PT referral depending on xray report.

## 2023-10-25 NOTE — Telephone Encounter (Signed)
Called to schedule patient a follow up appointment but the patient did not answer.  A voice mail never appeared the phone just kept ringing.

## 2023-10-30 ENCOUNTER — Ambulatory Visit: Payer: Commercial Managed Care - PPO | Admitting: Family Medicine

## 2023-10-30 ENCOUNTER — Encounter: Payer: Self-pay | Admitting: Family Medicine

## 2023-10-30 VITALS — BP 110/75 | HR 75 | Ht 68.0 in | Wt 263.4 lb

## 2023-10-30 DIAGNOSIS — E559 Vitamin D deficiency, unspecified: Secondary | ICD-10-CM | POA: Diagnosis not present

## 2023-10-30 DIAGNOSIS — L509 Urticaria, unspecified: Secondary | ICD-10-CM | POA: Insufficient documentation

## 2023-10-30 DIAGNOSIS — D649 Anemia, unspecified: Secondary | ICD-10-CM | POA: Diagnosis not present

## 2023-10-30 DIAGNOSIS — E538 Deficiency of other specified B group vitamins: Secondary | ICD-10-CM | POA: Diagnosis not present

## 2023-10-30 MED ORDER — CETIRIZINE HCL 10 MG PO TABS: 10.0000 mg | ORAL_TABLET | Freq: Every day | ORAL | 1 refills | Status: DC

## 2023-10-30 MED ORDER — TRIAMCINOLONE ACETONIDE 0.5 % EX OINT: 1.0000 | TOPICAL_OINTMENT | Freq: Two times a day (BID) | CUTANEOUS | 0 refills | Status: AC

## 2023-10-30 NOTE — Progress Notes (Addendum)
    SUBJECTIVE:   CHIEF COMPLAINT / HPI:   Rash This is a new problem. Episode onset: Started about 1 month ago, mostly at night time. The rash is diffuse (Arms, thigh, face, belly). The rash is characterized by itchiness (Skin looks like large insect bite. No redness or blistering). She was exposed to nothing (No change in diet. She changed her bed sheet.). Pertinent negatives include no cough or fever. (No new stress. She has a one year old, but no change in her stress level. No rash currently) Treatments tried: Vaseline and lotion at night. She put two blanket on her bed which help improve her symptoms. There is no history of allergies, asthma or eczema.  No new medications, she has been on ferrous sulfate and Vit D in the past and is on both now.  She did not pick up vitamin B12 yet at the pharmacy.  Vitamin deficiency: She is compliant with weekly Vit D and EOD ferrous sulfate. She did not pick up vitamin B12 yet at the pharmacy.   PERTINENT  PMH / PSH: PMHx reviewed  OBJECTIVE:   BP 110/75   Pulse 75   Ht 5\' 8"  (1.727 m)   Wt 263 lb 6.4 oz (119.5 kg)   LMP 10/19/2023 Comment: PCOS - not preg - per pt, shielded  SpO2 99%   BMI 40.05 kg/m   Physical Exam Vitals and nursing note reviewed.  Cardiovascular:     Rate and Rhythm: Normal rate and regular rhythm.     Heart sounds: Normal heart sounds. No murmur heard. Pulmonary:     Effort: Pulmonary effort is normal. No respiratory distress.     Breath sounds: Normal breath sounds. No wheezing.  Abdominal:     General: Abdomen is flat. There is no distension.     Palpations: Abdomen is soft. There is no mass.     Tenderness: There is no abdominal tenderness.     Hernia: No hernia is present.  Skin:    Findings: No erythema, lesion or rash.  Neurological:     General: No focal deficit present.   Picture sent by patient last week:    ASSESSMENT/PLAN:   Urticaria ?? Allergic reaction to bed sheet Vit B12 may present  this way. However, she have yet to start this supplement Zyrtec and Triamcinolone escribed Allergy testing recommended She declined referral to an allergist today Consider referral in the future   Vitamin D deficiency Compliant with supplement Recheck lab in 1 - 2 months  Anemia Compliant with her iron supplement  Vitamin B12 deficiency IM vs oral supplementation discussed Plan for oral supplementation for now She will pick up med OTC Repeat lab in 4 - 8 weeks and consider IM Vit B12 treatment if no improvement of her levels She agreed with the plan   Declined flu shot  Janit Pagan, MD [facility]

## 2023-10-30 NOTE — Assessment & Plan Note (Signed)
Compliant with her iron supplement

## 2023-10-30 NOTE — Assessment & Plan Note (Signed)
Compliant with supplement Recheck lab in 1 - 2 months

## 2023-10-30 NOTE — Patient Instructions (Signed)
Please start Vitamin b12 1000 mg every day over the counter  Hives Hives are itchy, red, swollen areas on your skin. They can show up on any part of your body. They often go away within 24 hours (acute hives). If you get new hives after the old ones fade and this goes on for many days or weeks, it is called chronic hives. Hives do not spread from person to person (are not contagious). Hives can happen when your body reacts to something that you are allergic to (allergen). These are sometimes called triggers. You can get hives right after being around a trigger, or hours later. What are the causes? Food allergies. Insect bites or stings. Allergies to pollen or pets. Spending time in sunlight, heat, or cold. Exercise. Stress. Other causes, such as: Viruses. This includes the common cold. Infections caused by germs (bacteria). Some medicines. Chemicals or latex. Allergy shots. Blood transfusions. In some cases, the cause is not known. What increases the risk? Being female. Being allergic to foods, such as: Citrus fruits. Milk. Eggs. Peanuts. Tree nuts. Shellfish. Being allergic to: Medicines. Latex. Insects. Animals. Pollen. What are the signs or symptoms?  Itchy, red or white bumps or spots on your skin. These areas may: Swell and get bigger. Change in shape and location. Stand alone or connect to each other over a large area of skin. Sting or hurt. Turn white when pressed in the center (blanch). In very bad cases, your hands, feet, and face may also swell. This may happen if hives start deeper in your skin. How is this treated? Treatment for hives depends on your symptoms. You may need to: Use cool, wet cloths (cool compresses) or take cool showers to stop the itching. Take or apply medicines to: Help with itching (antihistamines). Lessen swelling (corticosteroids). Treat infection (antibiotics). Have a medicine called omalizumab given to you as a shot. You may need  this if your hives do not get better with other treatments. In very bad cases, you may need to use a device filled with medicine that gives an emergency shot of epinephrine (auto-injector pen) to stop a very bad allergic reaction (anaphylactic reaction). Follow these instructions at home: Medicines Take or apply over-the-counter and prescription medicines only as told by your doctor. If you were prescribed antibiotics, use them as told by your doctor. Do not stop using them even if you start to feel better. Skin care Put cool, wet cloths on the hives. Do not scratch your skin. Do not rub your skin. General instructions Do not take hot showers or baths. This can make itching worse. Do not wear tight clothes. Use sunscreen. Wear clothes that cover your skin when you are outside. Avoid triggers that cause your hives. Keep a journal to help track what causes your hives. Write down: What medicines you take. What you eat and drink. What you put on your skin. Keep all follow-up visits. Your doctor will need to make sure treatment is working. Contact a doctor if: Your symptoms do not get better with medicine. Your joints hurt or swell. You have a fever. You have pain in your belly (abdomen). Get help right away if: Your tongue or lips swell. Your eyelids are swollen. Your chest or throat feels tight. You have trouble breathing or swallowing. These symptoms may be an emergency. Get help right away. Call 911. Do not wait to see if the symptoms will go away. Do not drive yourself to the hospital. This information is not intended to  replace advice given to you by your health care provider. Make sure you discuss any questions you have with your health care provider. Document Revised: 07/25/2022 Document Reviewed: 07/25/2022 Elsevier Patient Education  2024 [employer].

## 2023-10-30 NOTE — Assessment & Plan Note (Signed)
IM vs oral supplementation discussed Plan for oral supplementation for now She will pick up med OTC Repeat lab in 4 - 8 weeks and consider IM Vit B12 treatment if no improvement of her levels She agreed with the plan

## 2023-10-30 NOTE — Assessment & Plan Note (Signed)
??   Allergic reaction to bed sheet Vit B12 may present this way. However, she have yet to start this supplement Zyrtec and Triamcinolone escribed Allergy testing recommended She declined referral to an allergist today Consider referral in the future

## 2023-11-07 ENCOUNTER — Other Ambulatory Visit: Payer: Self-pay

## 2023-11-07 ENCOUNTER — Encounter: Payer: Self-pay | Admitting: Physical Therapy

## 2023-11-07 ENCOUNTER — Ambulatory Visit: Payer: Commercial Managed Care - PPO | Admitting: Physical Therapy

## 2023-11-07 DIAGNOSIS — R2689 Other abnormalities of gait and mobility: Secondary | ICD-10-CM

## 2023-11-07 DIAGNOSIS — M6281 Muscle weakness (generalized): Secondary | ICD-10-CM

## 2023-11-07 DIAGNOSIS — G8929 Other chronic pain: Secondary | ICD-10-CM

## 2023-11-07 DIAGNOSIS — M25562 Pain in left knee: Secondary | ICD-10-CM

## 2023-11-07 NOTE — Therapy (Signed)
OUTPATIENT PHYSICAL THERAPY LOWER EXTREMITY EVALUATION   Patient Name: Renee Lester MRN: [id number] DOB:[birth year], 27 y.o., female Today's Date: 11/07/2023  END OF SESSION:  PT End of Session - 11/07/23 1423     Visit Number 1    Number of Visits 10    Date for PT Re-Evaluation 01/03/24    Authorization Type [employer] PPO    Authorization Time Period $60 co-pay    PT Start Time 1345    PT Stop Time 1426    PT Time Calculation (min) 41 min    Activity Tolerance Patient limited by pain;Patient tolerated treatment well    Behavior During Therapy [facility] for tasks assessed/performed             Past Medical History:  Diagnosis Date   Gestational hypertension 05/26/2022   Past Surgical History:  Procedure Laterality Date   WISDOM TOOTH EXTRACTION  2012   Patient Active Problem List   Diagnosis Date Noted   Urticaria 10/30/2023   Vitamin B12 deficiency 10/30/2023   Left knee pain 10/16/2023   BMI 40.0-44.9, adult (HCC) 10/02/2023   Irregular periods/menstrual cycles 10/02/2023   Cold intolerance 10/02/2023   Anemia 10/02/2023   Vitamin D deficiency 10/02/2023   Alpha thalassemia silent carrier 12/19/2021   Carrier of spinal muscular atrophy 12/19/2021   Polycystic ovary syndrome 11/26/2018    PCP: Doreene Eland, MD  REFERRING PROVIDER: Doreene Eland, MD  REFERRING DIAG: [phone] (ICD-10-CM) - Chronic pain of left knee   THERAPY DIAG:  Chronic pain of left knee  Muscle weakness (generalized)  Other abnormalities of gait and mobility  Rationale for Evaluation and Treatment: Rehabilitation  ONSET DATE: 10/16/2203 MD referral to PT  SUBJECTIVE:   SUBJECTIVE STATEMENT: She started having left knee pain ~6 months ago with no known injury.  X-ray was negative.  Her PCP referred to PT.     PERTINENT HISTORY: Left knee pain, Alpha thalassemia silent carrier, carrier of spinal muscular atrophy, obesity  PAIN:  NPRS scale: today 8/10, in last  week ranging 0/10 sitting still, activities 5/10 - 10/10 Pain location: left knee more patella tendon area Pain description: aching Aggravating factors: stairs, walking more than 10 min Relieving factors: sit still   PRECAUTIONS: None  WEIGHT BEARING RESTRICTIONS: No  FALLS:  Has patient fallen in last 6 months? 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 No evidence of arthropathy or other focal bone abnormality. Soft tissues are unremarkable.  PATIENT SURVEYS:  FOTO intake:  47%  predicted:  68%  COGNITION: Overall cognitive status: WFL    SENSATION: WFL  POSTURE: posterior pelvic tilt and weight shift right  PALPATION: Left knee tenderness to palpation over patella tendon and knee joint medial / lateral to tendon.   LOWER EXTREMITY ROM:   ROM Right eval Left eval  Hip flexion    Hip extension    Hip abduction    Hip adduction    Hip internal rotation    Hip external rotation    Knee flexion  95*  Knee extension  A: LAQ 0* Pain  last 10*  Ankle dorsiflexion    Ankle plantarflexion    Ankle inversion    Ankle eversion     (Blank rows = not tested)  LOWER EXTREMITY MMT:  MMT Right eval Left eval  Hip flexion  4/5 + Knee pain  Hip extension  4/5 No knee pain  Hip abduction  4/5 + Knee pain  Hip adduction  4/5 + Knee pain  Hip internal rotation    Hip external rotation    Knee flexion  4/5 no Knee pain  Knee extension  4-/5 + Knee pain  Ankle dorsiflexion  5/5  No knee pain  Ankle plantarflexion  standing 3-4/5  No knee pain  Ankle  inversion    Ankle eversion     (Blank rows = not tested)  LOWER EXTREMITY SPECIAL TESTS:  Knee special tests: Apley's test: positive   FUNCTIONAL TESTS:  18 inch chair transfer: arises without UE assist if requested  GAIT: Distance walked: >200' Assistive device utilized: None Level of assistance: Modified independence Comments: decreased stance duration LLE, knee hyperextension initial contact / loading response,    TODAY'S TREATMENT                                                                          DATE: 11/07/2023: Therex: HEP instruction/performance c cues for techniques, handout provided.  Trial set performed of each for comprehension and symptom assessment.  See below for exercise list  PATIENT EDUCATION:  Education details: HEP, POC Person educated: Patient Education method: Explanation, Demonstration, Verbal cues, and Handouts Education comprehension: verbalized understanding, returned demonstration, and verbal cues required  HOME EXERCISE PROGRAM: Access Code: [email or web address] URL: [email or web address] Date: 11/07/2023 Prepared by: Vladimir Faster  Exercises - supine quad set with towel roll under ankle  - 1 x daily - 5-7 x weekly - 2-3 sets - 10 reps - 5 seconds hold - Supine Straight Leg Raises  - 1 x daily - 5-7 x weekly - 2-3 sets - 10 reps - 5 seconds hold - Straight Leg Raise with External Rotation  - 1 x daily - 5-7 x weekly - 2-3 sets - 10 reps - 5 seconds hold - Seated Quad Set  - 1 x daily - 5-7 x weekly - 2-3 sets - 10 reps - 5 seconds hold - Straight leg raise seated in chair  - 1 x daily - 5-7 x weekly - 2-3 sets - 10 reps - 5 seconds hold  ASSESSMENT: CLINICAL IMPRESSION: Patient is a 27 y.o. who comes to clinic with complaints of left knee pain with mobility, strength and movement coordination deficits that impair their ability to perform usual daily and recreational functional activities without increase difficulty/symptoms at this time.   Patient to benefit from skilled PT services to address impairments and limitations to improve to previous level of function without restriction secondary to condition.   OBJECTIVE IMPAIRMENTS: Abnormal gait, decreased endurance, decreased knowledge of condition, decreased mobility, difficulty walking, decreased ROM, decreased strength, impaired flexibility, obesity, and pain.   ACTIVITY LIMITATIONS: carrying, lifting, bending, standing, squatting, stairs, transfers, and locomotion level  PARTICIPATION LIMITATIONS: community activity and childcare for 27yo  PERSONAL FACTORS: Fitness and 1-2 comorbidities: see PMH  are also affecting patient's  functional outcome.   REHAB POTENTIAL: Good  CLINICAL DECISION MAKING: Stable/uncomplicated  EVALUATION COMPLEXITY: Low   GOALS: Goals reviewed with patient? Yes  SHORT TERM GOALS: (target date for Short term goals 12/07/2023)   1.  Patient will demonstrate independent use of home exercise program to maintain progress from in clinic treatments. Baseline: See objective data Goal status: Initial  LONG TERM GOALS: (target dates for all long term goals  01/03/2024 )   1. Patient will demonstrate/report pain at worst less than or equal to 2/10 to facilitate minimal limitation in daily activity secondary to pain symptoms. Baseline: See objective data Goal status: Initial   2. Patient will demonstrate independent use of home exercise program to facilitate ability to maintain/progress functional gains from skilled physical therapy services. Baseline: See objective data Goal status: Initial   3. Patient will demonstrate FOTO outcome > or = 68 % to indicate reduced disability due to condition. Baseline: See objective data Goal status: Initial   4.  Patient will demonstrate Left LE MMT 5/5 throughout to faciltiate usual transfers, stairs, squatting at [facility] for daily life.  Baseline: See objective data Goal status: Initial   5.  Patient negotiates  flight of stairs alternating pattern with single rail modified independent with knee increasing <2/10 during activity and no pain within 5 minutes.  Baseline: See objective data Goal status: Initial   PLAN:  PT FREQUENCY:  1-2x/week  PT DURATION: 8 weeks  PLANNED INTERVENTIONS: 97164- PT Re-evaluation, 97110-Therapeutic exercises, 97530- Therapeutic activity, [id number]- Neuromuscular re-education, 97535- Self Care, 13086- Manual therapy, [id number]- Gait training, [phone]- Aquatic Therapy, [phone]- Electrical stimulation (manual), [phone]- Ionotophoresis 4mg /ml Dexamethasone, Patient/Family education, Balance training, Stair training, Taping, Dry Needling, Joint mobilization, Cryotherapy, and Moist heat  PLAN FOR NEXT SESSION: Review & Update HEP. Assess quad length with prone knee flexion compared to supine.  Dry needling patella tendon.    Vladimir Faster, PT, DPT 11/07/2023, 2:47 PM

## 2023-11-20 ENCOUNTER — Encounter: Payer: Commercial Managed Care - PPO | Admitting: Rehabilitative and Restorative Service Providers"

## 2023-11-27 ENCOUNTER — Ambulatory Visit: Payer: Commercial Managed Care - PPO | Admitting: Family Medicine

## 2023-11-28 ENCOUNTER — Encounter: Payer: Commercial Managed Care - PPO | Admitting: Rehabilitative and Restorative Service Providers"

## 2023-12-05 ENCOUNTER — Encounter: Payer: Commercial Managed Care - PPO | Admitting: Physical Therapy

## 2023-12-12 ENCOUNTER — Encounter: Payer: Commercial Managed Care - PPO | Admitting: Physical Therapy

## 2024-01-11 ENCOUNTER — Encounter: Payer: Self-pay | Admitting: Family Medicine

## 2024-01-14 NOTE — Telephone Encounter (Signed)
 Called patient and scheduled her for a virtual visit with Dr.Zheng tomorrow @250 

## 2024-01-15 ENCOUNTER — Telehealth: Payer: Commercial Managed Care - PPO | Admitting: Family Medicine

## 2024-01-15 ENCOUNTER — Encounter: Payer: Self-pay | Admitting: Family Medicine

## 2024-01-15 DIAGNOSIS — J069 Acute upper respiratory infection, unspecified: Secondary | ICD-10-CM | POA: Diagnosis not present

## 2024-01-15 NOTE — Progress Notes (Signed)
 [facility] Telemedicine Visit  Patient consented to have virtual visit and was identified by name and date of birth. Method of visit: Telephone  Encounter participants: Patient: Renee Lester - located at home Provider: Lincoln Brigham - located at [facility]   Chief Complaint: "feeling sick"  HPI:  - Tested positive for Flu-A on Friday - Was feeling sick, body aches, voice going out, headaches, stuffy nose about 1 wks ago. - Denies fevers, diarrhea, vomiting - Appetite decreased initially, but improving. - Has been taking over-the-counter cough medicine (syrup) unsure what kind.  - Her child is also sick  ROS: per HPI  Pertinent PMHx: PCOS  Exam:  LMP 01/13/2024   Respiratory: Speaking in full sentences on room air.  Assessment/Plan:  Assessment & Plan Viral URI Seems to be improving from her recent viral URI.  Counseled patient on supportive care such as as needed ibuprofen and Tylenol, and hydration.  Advised patient to make sure that her cough syrup does not already contain ibuprofen or Tylenol.  Counseled on return precautions such as fevers and difficulty breathing.    Time spent during visit with patient: 20 minutes

## 2024-01-16 NOTE — Addendum Note (Signed)
 Addended by: Manson Passey, Montray Kliebert on: 01/16/2024 08:07 AM   Modules accepted: Level of Service

## 2024-02-17 ENCOUNTER — Telehealth: Admitting: Nurse Practitioner

## 2024-02-17 DIAGNOSIS — A084 Viral intestinal infection, unspecified: Secondary | ICD-10-CM | POA: Diagnosis not present

## 2024-02-17 MED ORDER — LOPERAMIDE HCL 2 MG PO TABS: 2.0000 mg | ORAL_TABLET | Freq: Three times a day (TID) | ORAL | 0 refills | Status: DC | PRN

## 2024-02-17 MED ORDER — ONDANSETRON HCL 4 MG PO TABS: 4.0000 mg | ORAL_TABLET | Freq: Three times a day (TID) | ORAL | 0 refills | Status: DC | PRN

## 2024-02-17 NOTE — Patient Instructions (Signed)
 Renee Lester, thank you for joining Claiborne Rigg, NP for today's virtual visit.  While this provider is not your primary care provider (PCP), if your PCP is located in our provider database this encounter information will be shared with them immediately following your visit.   A [facility] MyChart account gives you access to today's visit and all your visits, tests, and labs performed at [facility] " click here if you don't have a [facility] MyChart account or go to [email or web address]  Consent: (Patient) Renee Lester provided verbal consent for this virtual visit at the beginning of the encounter.  Current Medications:  Current Outpatient Medications:    loperamide (IMODIUM A-D) 2 MG tablet, Take 1 tablet (2 mg total) by mouth 3 (three) times daily as needed for diarrhea or loose stools., Disp: 30 tablet, Rfl: 0   ondansetron (ZOFRAN) 4 MG tablet, Take 1 tablet (4 mg total) by mouth every 8 (eight) hours as needed for nausea or vomiting., Disp: 30 tablet, Rfl: 0   cetirizine (ZYRTEC) 10 MG tablet, Take 1 tablet (10 mg total) by mouth daily., Disp: 90 tablet, Rfl: 1   cyanocobalamin (VITAMIN B12) 1000 MCG tablet, Take 1 tablet (1,000 mcg total) by mouth daily. (Patient not taking: Reported on 10/30/2023), Disp: 30 tablet, Rfl: 1   ferrous sulfate 325 (65 FE) MG EC tablet, Take 1 tablet (325 mg total) by mouth every other day., Disp: 45 tablet, Rfl: 1   triamcinolone ointment (KENALOG) 0.5 %, Apply 1 Application topically 2 (two) times daily., Disp: 30 g, Rfl: 0   Vitamin D, Ergocalciferol, (DRISDOL) 1.25 MG (50000 UNIT) CAPS capsule, Take 1 capsule (50,000 Units total) by mouth every 7 (seven) days., Disp: 5 capsule, Rfl: 1   Medications ordered in this encounter:  Meds ordered this encounter  Medications   ondansetron (ZOFRAN) 4 MG tablet    Sig: Take 1 tablet (4 mg total) by mouth every 8 (eight) hours as needed for nausea or vomiting.    Dispense:  30 tablet     Refill:  0    Supervising Provider:   Merrilee Jansky [[id number]]   loperamide (IMODIUM A-D) 2 MG tablet    Sig: Take 1 tablet (2 mg total) by mouth 3 (three) times daily as needed for diarrhea or loose stools.    Dispense:  30 tablet    Refill:  0    Supervising Provider:   Merrilee Jansky [[id number]]     *If you need refills on other medications prior to your next appointment, please contact your pharmacy*  Follow-Up: Call back or seek an in-person evaluation if the symptoms worsen or if the condition fails to improve as anticipated.  [facility] [phone]  Other Instructions Restart BRAT Diet. Do not advance unless able to tolerate clear liquids and clear foods for 24 hours.    If you have been instructed to have an in-person evaluation today at a local Urgent Care facility, please use the link below. It will take you to a list of all of our available [facility], including address, phone number and hours of operation. Please do not delay care.  [facility]  If you or a family member do not have a primary care provider, use the link below to schedule a visit and establish care. When you choose a [facility] primary care physician or advanced practice provider, you gain a long-term partner in health. Find a Primary  Care Provider  Learn more about [facility] in-office and virtual care options: [facility] - Get Care Now

## 2024-02-17 NOTE — Progress Notes (Signed)
 Virtual Visit Consent   Renee Lester, you are scheduled for a virtual visit with a [facility] provider today. Just as with appointments in the office, your consent must be obtained to participate. Your consent will be active for this visit and any virtual visit you may have with one of our providers in the next 365 days. If you have a MyChart account, a copy of this consent can be sent to you electronically.  As this is a virtual visit, video technology does not allow for your provider to perform a traditional examination. This may limit your provider's ability to fully assess your condition. If your provider identifies any concerns that need to be evaluated in person or the need to arrange testing (such as labs, EKG, etc.), we will make arrangements to do so. Although advances in technology are sophisticated, we cannot ensure that it will always work on either your end or our end. If the connection with a video visit is poor, the visit may have to be switched to a telephone visit. With either a video or telephone visit, we are not always able to ensure that we have a secure connection.  By engaging in this virtual visit, you consent to the provision of healthcare and authorize for your insurance to be billed (if applicable) for the services provided during this visit. Depending on your insurance coverage, you may receive a charge related to this service.  I need to obtain your verbal consent now. Are you willing to proceed with your visit today? [name] has provided verbal consent on 02/17/2024 for a virtual visit (video or telephone). Claiborne Rigg, NP  Date: 02/17/2024 7:27 PM   Virtual Visit via Video Note   I, Claiborne Rigg, connected with  Renee Lester  ([id number], January 07, 1996) on 02/17/24 at  7:15 PM EDT by a video-enabled telemedicine application and verified that I am speaking with the correct person using two identifiers.  Location: Patient: Virtual Visit Location  Patient: Home Provider: Virtual Visit Location Provider: Home Office   I discussed the limitations of evaluation and management by telemedicine and the availability of in person appointments. The patient expressed understanding and agreed to proceed.    History of Present Illness: Renee Lester is a 28 y.o. who identifies as a female who was assigned female at birth, and is being seen today for viral gastroenteritis .  Ms. Pisani believes she has "Food poisoning". States over the past 4 days she has been experiencing stomach ache, cramping, diarrhea along with nausea and vomiting. Symptoms had substantially decreased until today after eating a grilled cheese sandwich late last night. She had been tolerating apple juice, water, and pedialyte prior to Saturday night. She denies fever, blood or pus in stool.   Problems:  Patient Active Problem List   Diagnosis Date Noted   Urticaria 10/30/2023   Vitamin B12 deficiency 10/30/2023   Left knee pain 10/16/2023   BMI 40.0-44.9, adult (HCC) 10/02/2023   Irregular periods/menstrual cycles 10/02/2023   Cold intolerance 10/02/2023   Anemia 10/02/2023   Vitamin D deficiency 10/02/2023   Alpha thalassemia silent carrier 12/19/2021   Carrier of spinal muscular atrophy 12/19/2021   Polycystic ovary syndrome 11/26/2018    Allergies: No Known Allergies Medications:  Current Outpatient Medications:    loperamide (IMODIUM A-D) 2 MG tablet, Take 1 tablet (2 mg total) by mouth 3 (three) times daily as needed for diarrhea or loose stools., Disp: 30 tablet, Rfl: 0  ondansetron (ZOFRAN) 4 MG tablet, Take 1 tablet (4 mg total) by mouth every 8 (eight) hours as needed for nausea or vomiting., Disp: 30 tablet, Rfl: 0   cetirizine (ZYRTEC) 10 MG tablet, Take 1 tablet (10 mg total) by mouth daily., Disp: 90 tablet, Rfl: 1   cyanocobalamin (VITAMIN B12) 1000 MCG tablet, Take 1 tablet (1,000 mcg total) by mouth daily. (Patient not taking: Reported on 10/30/2023),  Disp: 30 tablet, Rfl: 1   ferrous sulfate 325 (65 FE) MG EC tablet, Take 1 tablet (325 mg total) by mouth every other day., Disp: 45 tablet, Rfl: 1   triamcinolone ointment (KENALOG) 0.5 %, Apply 1 Application topically 2 (two) times daily., Disp: 30 g, Rfl: 0   Vitamin D, Ergocalciferol, (DRISDOL) 1.25 MG (50000 UNIT) CAPS capsule, Take 1 capsule (50,000 Units total) by mouth every 7 (seven) days., Disp: 5 capsule, Rfl: 1  Observations/Objective: Patient is well-developed, well-nourished in no acute distress.  Resting comfortably at home.  Head is normocephalic, atraumatic.  No labored breathing.  Speech is clear and coherent with logical content.  Patient is alert and oriented at baseline.    Assessment and Plan: 1. Viral gastroenteritis (Primary) - ondansetron (ZOFRAN) 4 MG tablet; Take 1 tablet (4 mg total) by mouth every 8 (eight) hours as needed for nausea or vomiting.  Dispense: 30 tablet; Refill: 0 - loperamide (IMODIUM A-D) 2 MG tablet; Take 1 tablet (2 mg total) by mouth 3 (three) times daily as needed for diarrhea or loose stools.  Dispense: 30 tablet; Refill: 0  Restart BRAT Diet. Do not advance unless able to tolerate clear liquids and clear foods for 24 hours.   Follow Up Instructions: I discussed the assessment and treatment plan with the patient. The patient was provided an opportunity to ask questions and all were answered. The patient agreed with the plan and demonstrated an understanding of the instructions.  A copy of instructions were sent to the patient via MyChart unless otherwise noted below.    The patient was advised to call back or seek an in-person evaluation if the symptoms worsen or if the condition fails to improve as anticipated.    Claiborne Rigg, NP

## 2024-03-03 ENCOUNTER — Encounter: Payer: Self-pay | Admitting: Family Medicine

## 2024-03-04 NOTE — Telephone Encounter (Signed)
 Contacted the patient but she did not answer. I was unable to leave a voice mail. If she contact us  back please schedule her in skin clinic.

## 2024-03-12 ENCOUNTER — Ambulatory Visit

## 2024-08-22 ENCOUNTER — Ambulatory Visit: Admitting: Family Medicine

## 2024-08-22 ENCOUNTER — Encounter: Payer: Self-pay | Admitting: Family Medicine

## 2024-08-22 VITALS — BP 133/83 | HR 78 | Ht 68.0 in | Wt 264.8 lb

## 2024-08-22 DIAGNOSIS — E611 Iron deficiency: Secondary | ICD-10-CM

## 2024-08-22 DIAGNOSIS — E538 Deficiency of other specified B group vitamins: Secondary | ICD-10-CM

## 2024-08-22 DIAGNOSIS — N939 Abnormal uterine and vaginal bleeding, unspecified: Secondary | ICD-10-CM

## 2024-08-22 DIAGNOSIS — E282 Polycystic ovarian syndrome: Secondary | ICD-10-CM

## 2024-08-22 DIAGNOSIS — Z0000 Encounter for general adult medical examination without abnormal findings: Secondary | ICD-10-CM

## 2024-08-22 DIAGNOSIS — E559 Vitamin D deficiency, unspecified: Secondary | ICD-10-CM

## 2024-08-22 LAB — POCT URINE PREGNANCY: Preg Test, Ur: NEGATIVE

## 2024-08-22 LAB — POCT GLYCOSYLATED HEMOGLOBIN (HGB A1C): Hemoglobin A1C: 5.3 % (ref 4.0–5.6)

## 2024-08-22 MED ORDER — MEDROXYPROGESTERONE ACETATE 10 MG PO TABS: 10.0000 mg | ORAL_TABLET | Freq: Every day | ORAL | 0 refills | Status: AC

## 2024-08-22 NOTE — Assessment & Plan Note (Signed)
Lab checked today.

## 2024-08-22 NOTE — Progress Notes (Signed)
    SUBJECTIVE:   CHIEF COMPLAINT / HPI:   Discussed the use of AI scribe software for clinical note transcription with the patient, who gave verbal consent to proceed.  History of Present Illness   Renee Lester is a 28 year old female with polycystic ovary syndrome who presents with prolonged menstrual bleeding.  She has been experiencing prolonged menstrual bleeding since August 12, 2024. The bleeding is lighter than a normal menstrual period but heavier than spotting, necessitating pad changes every four to five hours. This pattern of prolonged bleeding occurs approximately every six months and can last for three to four weeks if untreated.  In the past, she has been prescribed Provera by her OB-GYN, which effectively stops the bleeding within 48 hours of initiation. She is not currently on any birth control or contraceptives and is not trying to conceive. A previous OB-GYN suggested metformin, but she discontinued it due to gastrointestinal side effects and forgetfulness in taking the medication.  No current abdominal pain, nausea, or vomiting, and she experiences only normal menstrual cramping during the first few days of her period. She is not currently sexually active.  Her current medication regimen includes Vitamin B12, which she takes irregularly, and Vitamin D , which she takes once a week. She is not taking Zyrtec , Imodium , Zofran , or triamcinolone .  She has a history of iron deficiency anemia. She moved out of her previous residence after 2016 and has not received a flu shot since then.     Poor compliance with her B12 supplement  PERTINENT  PMH / PSH: PMHx reviewed  OBJECTIVE:   BP 133/83   Pulse 78   Ht 5' 8 (1.727 m)   Wt 264 lb 12.8 oz (120.1 kg)   LMP 08/12/2024   SpO2 100%   BMI 40.26 kg/m   Physical Exam   GEN: No distress CHEST: Lungs clear to auscultation, no wheezing. CARDIOVASCULAR: Heart sounds normal, S1S2, no murmurs. ABDOMEN: Normal bowel  sounds, no distention, no tenderness. GU: She declined examination       ASSESSMENT/PLAN:   Assessment & Plan PCOS (polycystic ovarian syndrome) Intermittent abnormal uterine bleeding due to PCOS. Previous Provera treatment effective. Concerns about medication adherence and side effects. Considering IUDs or Nexplanon for long-term management. - Prescribe Provera 10 mg daily for 10 days. - Order urine pregnancy test. Came back negative - Order blood tests for CBC, iron levels, and thyroid function. - Discuss long-term management options for menstrual regulation, including OCPs, IUDs, and Nexplanon. - She prefers no contraceptive for now - would alert me if anything changes - Schedule F/U with Gyn discussed Routine adult health maintenance Declined flu and covid shots Iron deficiency Anemia panel ordered Abnormal uterine bleeding PCOS above Vitamin B12 deficiency Lab checked today Vitamin D  deficiency Lab checked today     Otto Fairly, MD [facility]

## 2024-08-22 NOTE — Patient Instructions (Signed)
 Abnormal Uterine Bleeding    Abnormal uterine bleeding means bleeding more than normal from your womb (uterus). It can include:  Bleeding after sex.  Bleeding between monthly (menstrual) periods.  Bleeding that is heavier than normal.  Monthly periods that last longer than normal.  Bleeding after you have stopped having your monthly period (menopause).  You should see a doctor for any kind of bleeding that is not normal. Treatment depends on the cause of your bleeding and how much you bleed.  Follow these instructions at home:  Medicines  Take over-the-counter and prescription medicines only as told by your doctor.  Ask your doctor about:  Taking medicines such as aspirin and ibuprofen. Do not take these medicines unless your doctor tells you to take them.  Taking over-the-counter medicines, vitamins, herbs, and supplements.  You may be given iron pills. Take them as told by your doctor.  Managing constipation  If you take iron pills, you may need to take these actions to prevent or treat trouble pooping (constipation):  Drink enough fluid to keep your pee (urine) pale yellow.  Take over-the-counter or prescription medicines.  Eat foods that are high in fiber. These include beans, whole grains, and fresh fruits and vegetables.  Limit foods that are high in fat and sugar. These include fried or sweet foods.  Activity  Change your activity to decrease bleeding if you need to change your sanitary pad more than one time every 2 hours:  Lie in bed with your feet raised (elevated).  Place a cold pack on your lower belly.  Rest as much as you are able until the bleeding stops or slows down.  General instructions  Do not use tampons, douche, or have sex until your doctor says these things are okay.  Change your pads often.  Get regular exams. These include:  Pelvic exams.  Screenings for cancer of the cervix.  It is up to you to get the results of any tests that are done. Ask how to get your results when they are  ready.  Watch for any changes in your bleeding. For 2 months, write down:  When your monthly period starts.  When your monthly period ends.  When you get any abnormal bleeding from your vagina.  What problems you notice.  Keep all follow-up visits.  Contact a doctor if:  The bleeding lasts more than one week.  You feel dizzy at times.  You feel like you may vomit (nausea).  You vomit.  You feel light-headed or weak.  Your symptoms get worse.  Get help right away if:  You faint.  You have to change pads every hour.  You have pain in your belly.  You have a fever or chills.  You get sweaty or weak.  You pass large blood clots from your vagina.  These symptoms may be an emergency. Get help right away. Call your local emergency services (911 in the [place]  Do not wait to see if the symptoms will go away.  Do not drive yourself to the hospital.  Summary  Abnormal uterine bleeding means bleeding more than normal from your womb (uterus).  Any kind of bleeding that is not normal should be checked by a doctor.  Treatment depends on the cause of your bleeding and how much you bleed.  Get help right away if you faint, you have to change pads every hour, or you pass large blood clots from your vagina.  This information is not intended to replace  advice given to you by your health care provider. Make sure you discuss any questions you have with your health care provider.  Document Revised: 03/08/2021 Document Reviewed: 03/08/2021  Elsevier Patient Education  2024 [employer].

## 2024-08-23 ENCOUNTER — Ambulatory Visit: Payer: Self-pay | Admitting: Family Medicine

## 2024-08-23 DIAGNOSIS — E538 Deficiency of other specified B group vitamins: Secondary | ICD-10-CM

## 2024-08-23 LAB — ANEMIA PROFILE B
Basophils Absolute: 0 x10E3/uL (ref 0.0–0.2)
Basos: 0 %
EOS (ABSOLUTE): 0 x10E3/uL (ref 0.0–0.4)
Eos: 1 %
Ferritin: 29 ng/mL (ref 15–150)
Folate: 5.9 ng/mL (ref >3.0)
Hematocrit: 38.2 % (ref 34.0–46.6)
Hemoglobin: 12 g/dL (ref 11.1–15.9)
Immature Grans (Abs): 0 x10E3/uL (ref 0.0–0.1)
Immature Granulocytes: 0 %
Iron Saturation: 17 % (ref 15–55)
Iron: 72 ug/dL (ref 27–159)
Lymphocytes Absolute: 1.3 x10E3/uL (ref 0.7–3.1)
Lymphs: 35 %
MCH: 27.5 pg (ref 26.6–33.0)
MCHC: 31.4 g/dL — ABNORMAL LOW (ref 31.5–35.7)
MCV: 88 fL (ref 79–97)
Monocytes Absolute: 0.3 x10E3/uL (ref 0.1–0.9)
Monocytes: 8 %
Neutrophils Absolute: 2.1 x10E3/uL (ref 1.4–7.0)
Neutrophils: 56 %
Platelets: 311 x10E3/uL (ref 150–450)
RBC: 4.36 x10E6/uL (ref 3.77–5.28)
RDW: 13 % (ref 11.7–15.4)
Retic Ct Pct: 1.8 % (ref 0.6–2.6)
Total Iron Binding Capacity: 416 ug/dL (ref 250–450)
UIBC: 344 ug/dL (ref 131–425)
Vitamin B-12: 199 pg/mL — ABNORMAL LOW (ref 232–1245)
WBC: 3.8 x10E3/uL (ref 3.4–10.8)

## 2024-08-23 LAB — VITAMIN D 25 HYDROXY (VIT D DEFICIENCY, FRACTURES): Vit D, 25-Hydroxy: 19.5 ng/mL — ABNORMAL LOW (ref 30.0–100.0)

## 2024-08-23 LAB — TSH RFX ON ABNORMAL TO FREE T4: TSH: 1.88 u[IU]/mL (ref 0.450–4.500)

## 2024-08-23 MED ORDER — VITAMIN D (ERGOCALCIFEROL) 1.25 MG (50000 UNIT) PO CAPS: 50000.0000 [IU] | ORAL_CAPSULE | ORAL | 1 refills | Status: AC

## 2024-08-23 MED ORDER — CYANOCOBALAMIN 1000 MCG/ML IJ SOLN
1000.0000 ug | INTRAMUSCULAR | Status: AC
  Administered 2024-09-19: 1000 ug via INTRAMUSCULAR

## 2024-08-23 MED ORDER — FERROUS SULFATE 325 (65 FE) MG PO TBEC: 325.0000 mg | DELAYED_RELEASE_TABLET | ORAL | 5 refills | Status: AC

## 2024-08-25 ENCOUNTER — Ambulatory Visit: Admitting: Family Medicine

## 2024-09-05 NOTE — Telephone Encounter (Signed)
 Tried reaching out to the patient 2x patient did not answer. Patient vm was not set up to leave a message.

## 2024-09-09 ENCOUNTER — Ambulatory Visit: Admission: EM | Admit: 2024-09-09 | Discharge: 2024-09-09 | Discharge status: Against Medical Advice

## 2024-09-09 NOTE — ED Provider Notes (Signed)
 Left without being seen. TID was 00:51.   Renee Lester, [place] 09/09/24 [phone]

## 2024-09-09 NOTE — Progress Notes (Signed)
 Renee Lester is a 28 y.o.  with  reports that she has never smoked. She has never been exposed to tobacco smoke. She has never used smokeless tobacco. with  Active Ambulatory Problems    Diagnosis Date Noted  . No Active Ambulatory Problems   Resolved Ambulatory Problems    Diagnosis Date Noted  . No Resolved Ambulatory Problems   Past Medical History:  Diagnosis Date  . Anemia   . Breast disorder   . PCOS (polycystic ovarian syndrome)    who presents today at Urgent Care for  Chief Complaint  Patient presents with  . Menorrhagia    Patient states she has PCOS and has been bleeding since 09/03/2024.  She states she was on Provera but has ran out Unable to get into GYN until December       History of Present Illness This is a 28 year old female with a history of polycystic ovarian syndrome (PCOS) presenting with abnormal uterine bleeding. She has been seen by her primary care physician and is awaiting a follow-up appointment with her OB/GYN.  The patient reports experiencing irregular menstrual cycles, which are more inconsistent in duration than in frequency. This is the second or third occurrence since her diagnosis of PCOS. She was previously unaware of her condition until she was prescribed Provera by her primary care physician. The medication successfully halted her symptoms within two days, and she discontinued its use after a 10-day course as directed. However, her symptoms resumed on 09/03/2024. She has consulted with her primary care physician, who has scheduled an appointment for the end of the month. Her OB/GYN has also scheduled an appointment for December 2025. She is currently seeking a solution to manage her symptoms until these appointments. She has undergone recent blood work, including thyroid function tests, all of which returned normal results. Her OB/GYN has suggested starting metformin, but this cannot be initiated over the phone. She has not been taking any other  medications, including Motrin  or ibuprofen .      Patient has no co-morbidities  or medications that might increase the risk for developing a severe infection     reports that she has never smoked. She has never been exposed to tobacco smoke. She has never used smokeless tobacco.  Review of Systems  Review of systems is otherwise negative except as noted in the HPI and Assessment/MDM   Physical Exam  BP 131/85 (BP Location: Left arm, Patient Position: Sitting)   Pulse 97   Temp 97 F (36.1 C) (Tympanic)   Resp 17   Ht 1.727 m (5' 8)   Wt 122 kg (270 lb)   LMP 09/03/2024   SpO2 100%   BMI 41.05 kg/m   Constitutional:      General: Patient is not in acute distress.    Appearance: Normal appearance.  Neurological:     General: No focal deficit present.     Mental Status: alert and oriented to person, place, and time.  HENT:     Eyes:     Conjunctiva/sclera: Conjunctivae normal. Pharynx normal    There is no associated anterior cervical lymphadenopathy    Pupils: Pupils are equal, round, and reactive to light.     TM's normal with no visible effusion  Cardiovascular:     Heart sounds: Normal heart sounds. No murmur heard.    No Lower extremity edema noted Pulmonary:     Effort: Pulmonary effort is normal. No respiratory distress.     Breath sounds: No  wheezing, rhonchi or rales.  Abdominal:     General: Bowel sounds are normal. There is no distension.     Tenderness: There is no abdominal tenderness.  Musculoskeletal:        General: Normal range of motion.     Neck:  No rigidity.  Skin:    General: Skin is warm and dry.  Psychiatric:        Mood and Affect: Mood normal.   No orders to display            DIAGNOSIS/PLAN     1. Abnormal uterine bleeding        Assessment & Plan Initial Assessment: 28 year old female with a history of polycystic ovarian syndrome (PCOS) presenting with abnormal uterine bleeding.  ED Course: - Prescribed  Provera 10 mg for 10 days - Prescribed metformin 500 mg twice daily for 30 days  Final Assessment: Prescribed Provera and metformin to manage abnormal uterine bleeding and PCOS symptoms. Advised on concurrent use and potential side effects.  Clinical Impression: - Polycystic Ovarian Syndrome (PCOS) - Abnormal Uterine Bleeding  Disposition: - Follow-Up: Appointment with primary care physician on 09/19/2024. OB/GYN appointment in 10/2024.  Patient Education: Advised that Provera and metformin can be taken concurrently. Informed about potential gastrointestinal discomfort from metformin and to discontinue if it occurs, consulting primary care before resuming. Advised to take Motrin  or ibuprofen  to potentially lighten menstrual flow.  Portions of this note were created using the aid of voice recognition Dragon/DAX dictation software.   We discussed risks and side effects of medications, and also discussed red flags which would warrant immediate follow-up.   Urgent Care Disposition:  Home Care

## 2024-09-09 NOTE — ED Triage Notes (Signed)
 Pt reports she has PCOS, had a menstrual period form 08/15/24 to 08/22/2024 and the started again 09/03/24 and still having heavy bleeding. Pt has not taken any meds. Pt waiting for OB/GYN office to contact her for an appt.

## 2024-09-09 NOTE — ED Notes (Addendum)
 Pt states she is on her lunch break and needs to leave-left NAD-steady gait

## 2024-09-09 NOTE — Telephone Encounter (Signed)
  Support Services: When patient returns call, please complete the following:      Message Called patient 2x left detailed message for ED visit today or follow up with PCP or GYN.

## 2024-09-09 NOTE — Telephone Encounter (Signed)
 Could the patient be seen for a virtual visit?  Yes   Date of Virtual Visit: September 09 2024   *If patient appointment must be scheduled today please mark message as high priority.  Appointment needed within:  Today   Reason for appointment below: Can be seen in ED today for severe symptoms, or schedule with GYN or PCP for follow up.

## 2024-09-18 ENCOUNTER — Ambulatory Visit

## 2024-09-19 ENCOUNTER — Ambulatory Visit: Admitting: Family Medicine

## 2024-09-19 ENCOUNTER — Encounter: Payer: Self-pay | Admitting: Family Medicine

## 2024-09-19 VITALS — BP 109/60 | Ht 68.0 in | Wt 275.4 lb

## 2024-09-19 DIAGNOSIS — N939 Abnormal uterine and vaginal bleeding, unspecified: Secondary | ICD-10-CM

## 2024-09-19 DIAGNOSIS — E538 Deficiency of other specified B group vitamins: Secondary | ICD-10-CM | POA: Diagnosis not present

## 2024-09-19 DIAGNOSIS — E282 Polycystic ovarian syndrome: Secondary | ICD-10-CM

## 2024-09-19 DIAGNOSIS — E559 Vitamin D deficiency, unspecified: Secondary | ICD-10-CM

## 2024-09-19 LAB — POCT URINE PREGNANCY: Preg Test, Ur: NEGATIVE

## 2024-09-19 MED ORDER — METFORMIN HCL 500 MG PO TABS: 500.0000 mg | ORAL_TABLET | Freq: Two times a day (BID) | ORAL | 1 refills | Status: AC

## 2024-09-19 NOTE — Assessment & Plan Note (Addendum)
 Metformin initiated Monitor closely

## 2024-09-19 NOTE — Assessment & Plan Note (Addendum)
 Vitamin B12 deficiency managed with intramuscular injections. Plan to recheck B12 levels after next injection to assess response. - Administer vitamin B12 injection every two months. - Recheck B12 levels after next injection.

## 2024-09-19 NOTE — Assessment & Plan Note (Addendum)
 Vitamin D  deficiency managed with oral vitamin D  supplementation.

## 2024-09-19 NOTE — Assessment & Plan Note (Addendum)
 Menstrual irregularity and abnormal uterine bleeding associated with polycystic ovarian syndrome (PCOS) Menstrual irregularity and abnormal uterine bleeding likely due to PCOS. Discussed monitoring bleeding and potential treatments including combined estrogen and progesterone, and metformin for ovulation regulation. Further evaluation with hormone tests and ultrasound needed. - Finish current course of Provera. - Order urine pregnancy test. - Order blood tests for estrogen, FSH, and LH levels. - Order pelvic ultrasound. - Refer to gynecologist for further evaluation. - Consider alternative treatments if bleeding persists or is heavy. - Discussed OCP, IUD or Nexplanon in the future if menses return and is heavy or persistent - Send prescription for metformin to pharmacy. - Complete Provera and follow up with Gyn as planned.

## 2024-09-19 NOTE — Assessment & Plan Note (Addendum)
 Metformin escribed Repeat pelvic US  F/U Gyn

## 2024-09-19 NOTE — Patient Instructions (Signed)
 Abnormal Uterine Bleeding    Abnormal uterine bleeding means bleeding more than normal from your womb (uterus). It can include:  Bleeding after sex.  Bleeding between monthly (menstrual) periods.  Bleeding that is heavier than normal.  Monthly periods that last longer than normal.  Bleeding after you have stopped having your monthly period (menopause).  You should see a doctor for any kind of bleeding that is not normal. Treatment depends on the cause of your bleeding and how much you bleed.  Follow these instructions at home:  Medicines  Take over-the-counter and prescription medicines only as told by your doctor.  Ask your doctor about:  Taking medicines such as aspirin and ibuprofen. Do not take these medicines unless your doctor tells you to take them.  Taking over-the-counter medicines, vitamins, herbs, and supplements.  You may be given iron pills. Take them as told by your doctor.  Managing constipation  If you take iron pills, you may need to take these actions to prevent or treat trouble pooping (constipation):  Drink enough fluid to keep your pee (urine) pale yellow.  Take over-the-counter or prescription medicines.  Eat foods that are high in fiber. These include beans, whole grains, and fresh fruits and vegetables.  Limit foods that are high in fat and sugar. These include fried or sweet foods.  Activity  Change your activity to decrease bleeding if you need to change your sanitary pad more than one time every 2 hours:  Lie in bed with your feet raised (elevated).  Place a cold pack on your lower belly.  Rest as much as you are able until the bleeding stops or slows down.  General instructions  Do not use tampons, douche, or have sex until your doctor says these things are okay.  Change your pads often.  Get regular exams. These include:  Pelvic exams.  Screenings for cancer of the cervix.  It is up to you to get the results of any tests that are done. Ask how to get your results when they are  ready.  Watch for any changes in your bleeding. For 2 months, write down:  When your monthly period starts.  When your monthly period ends.  When you get any abnormal bleeding from your vagina.  What problems you notice.  Keep all follow-up visits.  Contact a doctor if:  The bleeding lasts more than one week.  You feel dizzy at times.  You feel like you may vomit (nausea).  You vomit.  You feel light-headed or weak.  Your symptoms get worse.  Get help right away if:  You faint.  You have to change pads every hour.  You have pain in your belly.  You have a fever or chills.  You get sweaty or weak.  You pass large blood clots from your vagina.  These symptoms may be an emergency. Get help right away. Call your local emergency services (911 in the [place]  Do not wait to see if the symptoms will go away.  Do not drive yourself to the hospital.  Summary  Abnormal uterine bleeding means bleeding more than normal from your womb (uterus).  Any kind of bleeding that is not normal should be checked by a doctor.  Treatment depends on the cause of your bleeding and how much you bleed.  Get help right away if you faint, you have to change pads every hour, or you pass large blood clots from your vagina.  This information is not intended to replace  advice given to you by your health care provider. Make sure you discuss any questions you have with your health care provider.  Document Revised: 03/08/2021 Document Reviewed: 03/08/2021  Elsevier Patient Education  2024 [employer].

## 2024-09-19 NOTE — Progress Notes (Signed)
    SUBJECTIVE:   CHIEF COMPLAINT / HPI:   Discussed the use of AI scribe software for clinical note transcription with the patient, who gave verbal consent to proceed.  History of Present Illness   Renee Lester is a 28 year old female with polycystic ovarian syndrome who presents with abnormal uterine bleeding.  She has been experiencing abnormal uterine bleeding, which she improved with the use of Provera. She initially stopped taking Provera on September 01, 2024, and noticed bleeding resumed on September 03, 2024, starting as brownish discharge and turning red by September 04, 2024. She restarted Provera on September 10, 2024, and reports that the bleeding ceased while on the medication. However, she notes that if she stops taking Provera, she experiences brown discharge within 40 hours.  She was prescribed metformin 500 mg twice daily for her polycystic ovarian syndrome, but has been unable to obtain the medication due to issues with the prescription at the pharmacy. Her mother attempted to pick up the medication, but was informed of a problem with the quantity.  Her current medications include vitamin D  and iron supplements, and she has recently received a vitamin B12 injection. She has two remaining doses of Provera 10 mg daily.  No abdominal pain, nausea, vomiting, dizziness, or weakness.       PERTINENT  PMH / PSH: PMHx reviewed  OBJECTIVE:   BP 109/60   Ht 5' 8 (1.727 m)   Wt 275 lb 6 oz (124.9 kg)   LMP 09/03/2024   BMI 41.87 kg/m   Physical Exam   CHEST: Lungs clear to auscultation, no wheezing, no crepitus. CARDIOVASCULAR: Heart regular rate and rhythm, no murmurs. ABDOMEN: Normal bowel sounds, no tenderness, no distension. EXTREMITIES: No leg edema.       ASSESSMENT/PLAN:   Assessment & Plan Abnormal uterine bleeding (AUB) Menstrual irregularity and abnormal uterine bleeding associated with polycystic ovarian syndrome (PCOS) Menstrual irregularity and abnormal  uterine bleeding likely due to PCOS. Discussed monitoring bleeding and potential treatments including combined estrogen and progesterone, and metformin for ovulation regulation. Further evaluation with hormone tests and ultrasound needed. - Finish current course of Provera. - Order urine pregnancy test. - Order blood tests for estrogen, FSH, and LH levels. - Order pelvic ultrasound. - Refer to gynecologist for further evaluation. - Consider alternative treatments if bleeding persists or is heavy. - Discussed OCP, IUD or Nexplanon in the future if menses return and is heavy or persistent - Send prescription for metformin to pharmacy. - Complete Provera and follow up with Gyn as planned.  Polycystic ovary syndrome Metformin escribed Repeat pelvic US  F/U Gyn Vitamin B12 deficiency Vitamin B12 deficiency managed with intramuscular injections. Plan to recheck B12 levels after next injection to assess response. - Administer vitamin B12 injection every two months. - Recheck B12 levels after next injection. Morbid obesity (HCC) Metformin initiated Monitor closely Vitamin D  deficiency Vitamin D  deficiency managed with oral vitamin D  supplementation.   Follow-Up Follow-up plans discussed to monitor treatment response and further evaluate menstrual irregularity and PCOS. - Schedule follow-up appointment after ultrasound and blood test results are available. - Call patient with test results. - Ensure patient schedules appointment with gynecologist.  Otto Fairly, MD [facility]

## 2024-09-20 ENCOUNTER — Ambulatory Visit: Payer: Self-pay | Admitting: Family Medicine

## 2024-09-25 LAB — ALB+PRL+FSH+LH+PROG+DHEA+ES...
Albumin: 4.1 g/dL (ref 4.0–5.0)
Dehydroepiandrosterone: 130 ng/dL (ref 31–701)
Estrogen: 197 pg/mL
FSH: 2.8 m[IU]/mL
LH: 3 m[IU]/mL
Progesterone: 0.3 ng/mL
Prolactin: 12.6 ng/mL (ref 4.8–33.4)
Sex Hormone Binding: 10.8 nmol/L — ABNORMAL LOW (ref 24.6–122.0)
Vit D, 25-Hydroxy: 17.6 ng/mL — ABNORMAL LOW (ref 30.0–100.0)

## 2024-10-10 ENCOUNTER — Ambulatory Visit (HOSPITAL_COMMUNITY)

## 2024-10-20 ENCOUNTER — Ambulatory Visit: Admitting: Family Medicine

## 2024-10-20 ENCOUNTER — Other Ambulatory Visit (HOSPITAL_COMMUNITY)
Admission: RE | Admit: 2024-10-20 | Discharge: 2024-10-20 | Disposition: A | Source: Ambulatory Visit | Attending: Family Medicine | Admitting: Family Medicine

## 2024-10-20 VITALS — BP 100/60 | Wt 280.0 lb

## 2024-10-20 DIAGNOSIS — Z113 Encounter for screening for infections with a predominantly sexual mode of transmission: Secondary | ICD-10-CM | POA: Diagnosis not present

## 2024-10-20 DIAGNOSIS — N898 Other specified noninflammatory disorders of vagina: Secondary | ICD-10-CM | POA: Diagnosis not present

## 2024-10-20 NOTE — Progress Notes (Signed)
    SUBJECTIVE:   CHIEF COMPLAINT / HPI:   STI testing Vaginal discharge -Requesting STI testing, no known exposures or new sexual partners -Currently having unprotected intercourse, would be okay with pregnancy if it occurred.  Declined interest in birth control. -LMP 10/13/2024, reports she had 2 cycles in the past month. -Currently having some vaginal discharge ongoing for the past few days, denies vaginal irritation  PERTINENT  PMH / PSH: PCOS  OBJECTIVE:   BP 100/60   Wt 280 lb (127 kg)   BMI 42.57 kg/m    General: NAD, pleasant, able to participate in exam Pelvic exam: normal external genitalia, vulva, vagina, cervix, uterus and adnexa, VAGINA: normal appearing vagina with normal color and discharge, no lesions, vaginal discharge - white, copious, and curd-like, exam chaperoned by Page,CMA  ASSESSMENT/PLAN:   Assessment & Plan Vaginal discharge Wet prep not available in clinic, will send out swab for BV and yeast.  LMP 10/13/2024, recently sure not pregnant.  Declined interest in birth control today and would be okay with pregnancy.  Recommend daily prenatal vitamin Routine screening for STI (sexually transmitted infection) G/C, HIV, RPR and hep C today.  No known exposures.   Dr. Izetta Nap, DO [facility]

## 2024-10-20 NOTE — Patient Instructions (Signed)
 It was wonderful to see you today! Thank you for choosing [facility].   Please bring ALL of your medications with you to every visit.   Today we talked about:  We swabbed you for STIs and vaginal infections.  We are also getting the STI blood work.  You will still those results on MyChart and if there is anything positive or concerning I will follow-up with you about those results. Your lungs sound good today, if you develop worsening shortness of breath that is new please follow-up for care.  You can continue use honey, lozenges or any of the over-the-counter cough syrups although they do not have great evidence.  You can have a cough that lingers for weeks after a viral illness which is common unfortunately.  Please follow up as needed for persistent symptoms   We are checking some labs today. If they are abnormal, I will call you. If they are normal, I will send you a MyChart message (if it is active) or a letter in the mail. If you do not hear about your labs in the next 2 weeks, please call the office.  Call the clinic at [phone] if your symptoms worsen or you have any concerns.  Please be sure to schedule follow up at the front desk before you leave today.   Izetta Nap, DO Family Medicine

## 2024-10-21 LAB — HIV ANTIBODY (ROUTINE TESTING W REFLEX): HIV Screen 4th Generation wRfx: NONREACTIVE

## 2024-10-21 LAB — HEPATITIS C ANTIBODY: Hep C Virus Ab: NONREACTIVE

## 2024-10-21 LAB — SYPHILIS: RPR W/REFLEX TO RPR TITER AND TREPONEMAL ANTIBODIES, TRADITIONAL SCREENING AND DIAGNOSIS ALGORITHM: RPR Ser Ql: NONREACTIVE

## 2024-10-22 ENCOUNTER — Ambulatory Visit: Payer: Self-pay | Admitting: Family Medicine

## 2024-10-22 LAB — CERVICOVAGINAL ANCILLARY ONLY
Bacterial Vaginitis (gardnerella): NEGATIVE
Candida Glabrata: NEGATIVE
Candida Vaginitis: NEGATIVE
Chlamydia: NEGATIVE
Comment: NEGATIVE
Comment: NEGATIVE
Comment: NEGATIVE
Comment: NEGATIVE
Comment: NEGATIVE
Comment: NORMAL
Neisseria Gonorrhea: NEGATIVE
Trichomonas: NEGATIVE

## 2024-10-24 ENCOUNTER — Ambulatory Visit

## 2024-11-17 ENCOUNTER — Encounter: Payer: Self-pay | Admitting: Family Medicine

## 2024-11-17 NOTE — Telephone Encounter (Signed)
 Patient is being seen in [facility] 12/30 @350 

## 2024-11-18 ENCOUNTER — Ambulatory Visit

## 2024-11-18 VITALS — BP 132/87 | HR 89 | Temp 98.2°F | Ht 68.0 in | Wt 279.8 lb

## 2024-11-18 DIAGNOSIS — H5789 Other specified disorders of eye and adnexa: Secondary | ICD-10-CM

## 2024-11-18 DIAGNOSIS — R051 Acute cough: Secondary | ICD-10-CM

## 2024-11-18 MED ORDER — OLOPATADINE HCL 0.1 % OP SOLN: 1.0000 [drp] | Freq: Two times a day (BID) | OPHTHALMIC | 12 refills | Status: AC

## 2024-11-18 NOTE — Progress Notes (Signed)
" ° ° °  SUBJECTIVE:   CHIEF COMPLAINT / HPI:   Renee Lester is a 28 y.o. female  presenting for sore throat, cough, eye irritation.   Symptoms started 5 days ago with cough and runny nose. Has progressed to eye irritation and malaise. Malaise has improved but eye irritation has worsened.  Tmax: subjective  Sick contacts: unknown  Signs of respiratory distress: no Appetite: normal  Hydration: normal   PERTINENT  PMH / PSH: Reviewed and updated   OBJECTIVE:   BP 132/87   Pulse 89   Temp 98.2 F (36.8 C) (Oral)   Ht 5' 8 (1.727 m)   Wt 279 lb 12.8 oz (126.9 kg)   LMP 11/07/2024   SpO2 100%   BMI 42.54 kg/m   Sick -appearing, no acute distress HEENT: erythematous nasal turbinates, clear TMs bilaterally, mild cervical lymphadenopathy bilaterally. Mild maxillary sinus tenderness Cardio: Regular rate, regular rhythm, no murmurs on exam. <2 sec capillary refill  Pulm: Clear, no wheezing, no crackles. No increased work of breathing Abdominal: bowel sounds present, soft, non-tender, non-distended  ASSESSMENT/PLAN:   Assessment & Plan Acute cough No signs of respiratory distress on exam. Patient appears well hydrated.  Most likely viral mediated, supportive therapy indicated with return precautions for trouble breathing or persistent fever.  Discussed nasal saline as needed for congestion  Eye irritation Prescribed Pataday drops for symptomatic relief     Damien Pinal, DO [facility]  "

## 2024-11-18 NOTE — Patient Instructions (Signed)
 Please use the eye drops as prescribed.   You can also use nasal saline over the counter to help with the nasal congestion.   Honey is great for cough and sore throat.   Return in 14 days if you start to worsen or have a fever
